# Patient Record
Sex: Female | Born: 1945
Health system: Southern US, Community
[De-identification: ages and names within clinical notes are randomized; demographics above are authoritative.]

## PROBLEM LIST (undated history)

## (undated) DIAGNOSIS — E669 Obesity, unspecified: Secondary | ICD-10-CM

## (undated) DIAGNOSIS — M810 Age-related osteoporosis without current pathological fracture: Secondary | ICD-10-CM

## (undated) DIAGNOSIS — Z8619 Personal history of other infectious and parasitic diseases: Secondary | ICD-10-CM

## (undated) DIAGNOSIS — Z9889 Other specified postprocedural states: Secondary | ICD-10-CM

## (undated) DIAGNOSIS — I10 Essential (primary) hypertension: Secondary | ICD-10-CM

## (undated) DIAGNOSIS — M1990 Unspecified osteoarthritis, unspecified site: Secondary | ICD-10-CM

## (undated) DIAGNOSIS — F325 Major depressive disorder, single episode, in full remission: Secondary | ICD-10-CM

## (undated) DIAGNOSIS — G4733 Obstructive sleep apnea (adult) (pediatric): Secondary | ICD-10-CM

## (undated) DIAGNOSIS — E119 Type 2 diabetes mellitus without complications: Secondary | ICD-10-CM

## (undated) DIAGNOSIS — K760 Fatty (change of) liver, not elsewhere classified: Secondary | ICD-10-CM

## (undated) HISTORY — PX: HERNIA REPAIR: SHX51

## (undated) HISTORY — DX: Fatty (change of) liver, not elsewhere classified: K76.0

## (undated) HISTORY — DX: Major depressive disorder, single episode, in full remission: F32.5

## (undated) HISTORY — PX: TONSILLECTOMY: SUR1361

## (undated) HISTORY — DX: Personal history of other infectious and parasitic diseases: Z86.19

## (undated) HISTORY — DX: Age-related osteoporosis without current pathological fracture: M81.0

## (undated) HISTORY — DX: Obesity, unspecified: E66.9

## (undated) HISTORY — DX: Obstructive sleep apnea (adult) (pediatric): G47.33

## (undated) HISTORY — DX: Essential (primary) hypertension: I10

---

## 2001-12-29 ENCOUNTER — Other Ambulatory Visit: Admission: RE | Admit: 2001-12-29 | Discharge: 2001-12-29 | Payer: Self-pay | Admitting: Internal Medicine

## 2001-12-31 ENCOUNTER — Encounter: Payer: Self-pay | Admitting: Internal Medicine

## 2001-12-31 ENCOUNTER — Encounter: Admission: RE | Admit: 2001-12-31 | Discharge: 2001-12-31 | Payer: Self-pay | Admitting: Internal Medicine

## 2003-02-04 ENCOUNTER — Other Ambulatory Visit: Admission: RE | Admit: 2003-02-04 | Discharge: 2003-02-04 | Payer: Self-pay | Admitting: Internal Medicine

## 2004-05-08 ENCOUNTER — Other Ambulatory Visit: Admission: RE | Admit: 2004-05-08 | Discharge: 2004-05-08 | Payer: Self-pay | Admitting: Internal Medicine

## 2004-06-07 ENCOUNTER — Ambulatory Visit (HOSPITAL_COMMUNITY): Admission: RE | Admit: 2004-06-07 | Discharge: 2004-06-07 | Payer: Self-pay | Admitting: *Deleted

## 2005-05-10 ENCOUNTER — Other Ambulatory Visit: Admission: RE | Admit: 2005-05-10 | Discharge: 2005-05-10 | Payer: Self-pay | Admitting: Internal Medicine

## 2006-06-07 ENCOUNTER — Other Ambulatory Visit: Admission: RE | Admit: 2006-06-07 | Discharge: 2006-06-07 | Payer: Self-pay | Admitting: Family Medicine

## 2010-08-07 ENCOUNTER — Other Ambulatory Visit: Payer: Self-pay | Admitting: Family Medicine

## 2010-08-07 DIAGNOSIS — M25512 Pain in left shoulder: Secondary | ICD-10-CM

## 2010-08-09 ENCOUNTER — Ambulatory Visit
Admission: RE | Admit: 2010-08-09 | Discharge: 2010-08-09 | Disposition: A | Discharge status: Home / Self Care | Payer: Medicare Other | Source: Ambulatory Visit | Attending: Family Medicine | Admitting: Family Medicine

## 2010-08-09 DIAGNOSIS — M25512 Pain in left shoulder: Secondary | ICD-10-CM

## 2011-08-17 DIAGNOSIS — E559 Vitamin D deficiency, unspecified: Secondary | ICD-10-CM | POA: Diagnosis not present

## 2011-09-12 DIAGNOSIS — M810 Age-related osteoporosis without current pathological fracture: Secondary | ICD-10-CM | POA: Diagnosis not present

## 2011-11-17 DIAGNOSIS — J069 Acute upper respiratory infection, unspecified: Secondary | ICD-10-CM | POA: Diagnosis not present

## 2012-01-08 DIAGNOSIS — E559 Vitamin D deficiency, unspecified: Secondary | ICD-10-CM | POA: Diagnosis not present

## 2012-03-10 DIAGNOSIS — Z961 Presence of intraocular lens: Secondary | ICD-10-CM | POA: Diagnosis not present

## 2012-03-10 DIAGNOSIS — H524 Presbyopia: Secondary | ICD-10-CM | POA: Diagnosis not present

## 2012-03-10 DIAGNOSIS — H2640 Unspecified secondary cataract: Secondary | ICD-10-CM | POA: Diagnosis not present

## 2012-03-12 DIAGNOSIS — M810 Age-related osteoporosis without current pathological fracture: Secondary | ICD-10-CM | POA: Diagnosis not present

## 2012-03-12 DIAGNOSIS — Z23 Encounter for immunization: Secondary | ICD-10-CM | POA: Diagnosis not present

## 2012-03-12 DIAGNOSIS — R7301 Impaired fasting glucose: Secondary | ICD-10-CM | POA: Diagnosis not present

## 2012-03-12 DIAGNOSIS — Z1211 Encounter for screening for malignant neoplasm of colon: Secondary | ICD-10-CM | POA: Diagnosis not present

## 2012-03-12 DIAGNOSIS — I10 Essential (primary) hypertension: Secondary | ICD-10-CM | POA: Diagnosis not present

## 2012-03-12 DIAGNOSIS — E559 Vitamin D deficiency, unspecified: Secondary | ICD-10-CM | POA: Diagnosis not present

## 2012-03-12 DIAGNOSIS — Z0000 Encounter for general adult medical examination without abnormal findings: Secondary | ICD-10-CM | POA: Diagnosis not present

## 2012-03-26 DIAGNOSIS — R7989 Other specified abnormal findings of blood chemistry: Secondary | ICD-10-CM | POA: Diagnosis not present

## 2012-03-26 DIAGNOSIS — M810 Age-related osteoporosis without current pathological fracture: Secondary | ICD-10-CM | POA: Diagnosis not present

## 2012-03-31 DIAGNOSIS — I10 Essential (primary) hypertension: Secondary | ICD-10-CM | POA: Diagnosis not present

## 2012-03-31 DIAGNOSIS — R0681 Apnea, not elsewhere classified: Secondary | ICD-10-CM | POA: Diagnosis not present

## 2012-03-31 DIAGNOSIS — G4710 Hypersomnia, unspecified: Secondary | ICD-10-CM | POA: Diagnosis not present

## 2012-04-01 DIAGNOSIS — K760 Fatty (change of) liver, not elsewhere classified: Secondary | ICD-10-CM

## 2012-04-01 HISTORY — DX: Fatty (change of) liver, not elsewhere classified: K76.0

## 2012-04-11 DIAGNOSIS — R7989 Other specified abnormal findings of blood chemistry: Secondary | ICD-10-CM | POA: Diagnosis not present

## 2012-04-11 DIAGNOSIS — E559 Vitamin D deficiency, unspecified: Secondary | ICD-10-CM | POA: Diagnosis not present

## 2012-04-28 DIAGNOSIS — G4733 Obstructive sleep apnea (adult) (pediatric): Secondary | ICD-10-CM | POA: Diagnosis not present

## 2012-04-28 DIAGNOSIS — Z1231 Encounter for screening mammogram for malignant neoplasm of breast: Secondary | ICD-10-CM | POA: Diagnosis not present

## 2012-07-07 DIAGNOSIS — E559 Vitamin D deficiency, unspecified: Secondary | ICD-10-CM | POA: Diagnosis not present

## 2012-07-07 DIAGNOSIS — E669 Obesity, unspecified: Secondary | ICD-10-CM | POA: Diagnosis not present

## 2012-07-07 DIAGNOSIS — R6889 Other general symptoms and signs: Secondary | ICD-10-CM | POA: Diagnosis not present

## 2012-07-07 DIAGNOSIS — G4733 Obstructive sleep apnea (adult) (pediatric): Secondary | ICD-10-CM | POA: Diagnosis not present

## 2012-07-07 DIAGNOSIS — I10 Essential (primary) hypertension: Secondary | ICD-10-CM | POA: Diagnosis not present

## 2012-09-23 DIAGNOSIS — E559 Vitamin D deficiency, unspecified: Secondary | ICD-10-CM | POA: Diagnosis not present

## 2012-09-23 DIAGNOSIS — M810 Age-related osteoporosis without current pathological fracture: Secondary | ICD-10-CM | POA: Diagnosis not present

## 2013-02-09 DIAGNOSIS — G4733 Obstructive sleep apnea (adult) (pediatric): Secondary | ICD-10-CM | POA: Diagnosis not present

## 2013-02-09 DIAGNOSIS — E669 Obesity, unspecified: Secondary | ICD-10-CM | POA: Diagnosis not present

## 2013-02-09 DIAGNOSIS — I10 Essential (primary) hypertension: Secondary | ICD-10-CM | POA: Diagnosis not present

## 2013-03-18 DIAGNOSIS — Z0000 Encounter for general adult medical examination without abnormal findings: Secondary | ICD-10-CM | POA: Diagnosis not present

## 2013-03-18 DIAGNOSIS — I10 Essential (primary) hypertension: Secondary | ICD-10-CM | POA: Diagnosis not present

## 2013-03-18 DIAGNOSIS — E559 Vitamin D deficiency, unspecified: Secondary | ICD-10-CM | POA: Diagnosis not present

## 2013-03-18 DIAGNOSIS — G4733 Obstructive sleep apnea (adult) (pediatric): Secondary | ICD-10-CM | POA: Diagnosis not present

## 2013-03-18 DIAGNOSIS — Z23 Encounter for immunization: Secondary | ICD-10-CM | POA: Diagnosis not present

## 2013-03-18 DIAGNOSIS — Z136 Encounter for screening for cardiovascular disorders: Secondary | ICD-10-CM | POA: Diagnosis not present

## 2013-03-18 DIAGNOSIS — M810 Age-related osteoporosis without current pathological fracture: Secondary | ICD-10-CM | POA: Diagnosis not present

## 2013-03-18 DIAGNOSIS — J019 Acute sinusitis, unspecified: Secondary | ICD-10-CM | POA: Diagnosis not present

## 2013-03-18 DIAGNOSIS — R7301 Impaired fasting glucose: Secondary | ICD-10-CM | POA: Diagnosis not present

## 2013-03-23 DIAGNOSIS — H52209 Unspecified astigmatism, unspecified eye: Secondary | ICD-10-CM | POA: Diagnosis not present

## 2013-03-23 DIAGNOSIS — Z961 Presence of intraocular lens: Secondary | ICD-10-CM | POA: Diagnosis not present

## 2013-04-06 DIAGNOSIS — M810 Age-related osteoporosis without current pathological fracture: Secondary | ICD-10-CM | POA: Diagnosis not present

## 2013-04-30 DIAGNOSIS — Z1231 Encounter for screening mammogram for malignant neoplasm of breast: Secondary | ICD-10-CM | POA: Diagnosis not present

## 2013-04-30 DIAGNOSIS — M899 Disorder of bone, unspecified: Secondary | ICD-10-CM | POA: Diagnosis not present

## 2013-06-15 DIAGNOSIS — Z23 Encounter for immunization: Secondary | ICD-10-CM | POA: Diagnosis not present

## 2013-06-15 DIAGNOSIS — R7301 Impaired fasting glucose: Secondary | ICD-10-CM | POA: Diagnosis not present

## 2013-08-13 ENCOUNTER — Ambulatory Visit: Payer: No Typology Code available for payment source | Admitting: Cardiology

## 2013-08-24 ENCOUNTER — Encounter: Payer: Self-pay | Admitting: General Surgery

## 2013-08-24 DIAGNOSIS — I10 Essential (primary) hypertension: Secondary | ICD-10-CM | POA: Insufficient documentation

## 2013-08-24 DIAGNOSIS — G4733 Obstructive sleep apnea (adult) (pediatric): Secondary | ICD-10-CM | POA: Insufficient documentation

## 2013-08-24 DIAGNOSIS — E669 Obesity, unspecified: Secondary | ICD-10-CM | POA: Insufficient documentation

## 2013-08-25 ENCOUNTER — Ambulatory Visit: Payer: No Typology Code available for payment source | Admitting: Cardiology

## 2013-09-18 ENCOUNTER — Ambulatory Visit (INDEPENDENT_AMBULATORY_CARE_PROVIDER_SITE_OTHER): Payer: Medicare Other | Admitting: Cardiology

## 2013-09-18 ENCOUNTER — Encounter: Payer: Self-pay | Admitting: Cardiology

## 2013-09-18 VITALS — BP 162/80 | HR 74 | Ht 65.0 in | Wt 214.8 lb

## 2013-09-18 DIAGNOSIS — E669 Obesity, unspecified: Secondary | ICD-10-CM

## 2013-09-18 DIAGNOSIS — I10 Essential (primary) hypertension: Secondary | ICD-10-CM | POA: Diagnosis not present

## 2013-09-18 DIAGNOSIS — G4733 Obstructive sleep apnea (adult) (pediatric): Secondary | ICD-10-CM

## 2013-09-18 NOTE — Patient Instructions (Signed)
Your physician recommends that you continue on your current medications as directed. Please refer to the Current Medication list given to you today.  Your physician wants you to follow-up in: 6 months with Dr Turner You will receive a reminder letter in the mail two months in advance. If you don't receive a letter, please call our office to schedule the follow-up appointment.  

## 2013-09-18 NOTE — Progress Notes (Signed)
  [address] [place], [place]  [address] Phone: [phone] Fax:  [phone]) [phone]  Date:  09/18/2013   ID:  Heather Santos, DOB 1946-03-25, MRN [id number]  PCP:  Lupita RaiderSHAW,KIMBERLEE, MD  Sleep Medicine:  Armanda Magicraci Mahagony Grieb, MD   History of Present Illness: Heather NannyBetty A Herdt is a 68 y.o. female with a history of severe OSA on CPAP, HTN and obesity presents today for followup.  She is doing well.  She tolerates her CPAP well.  She tolerates the new nasal pillow mask and has no problems with it.  She feels the pressure is adequate.  She feels rested when she get up in the am if she sleeps ok and has no daytime sleepiness.  She has not been sleeping well recently due to some stress in her life.   Wt Readings from Last 3 Encounters:  No data found for Wt     Past Medical History  Diagnosis Date  . Major depression in remission   . Hypertension   . Obesity   . Osteoporosis     started prolia 2012  . History of shingles   . Fatty liver 10/13    on U/S w elevated LFTs  . OSA (obstructive sleep apnea)     w CPAP AHI 35/hr now on CPAP at 16 cm H2O    Current Outpatient Prescriptions  Medication Sig Dispense Refill  . Calcium Carbonate-Vit D-Min (CALCIUM 1200 PO) Take 1 tablet by mouth 2 (two) times daily.      . Cholecalciferol 5000 UNITS CHEW Chew 1 tablet by mouth daily.      Marland Kitchen. denosumab (PROLIA) 60 MG/ML SOLN injection Inject 60 mg into the skin every 6 (six) months. Administer in upper arm, thigh, or abdomen      . Multiple Vitamin (MULTIVITAMIN) tablet Take 1 tablet by mouth daily.       No current facility-administered medications for this visit.    Allergies:   No Known Allergies  Social History:  The patient  reports that she has never smoked. She does not have any smokeless tobacco history on file. She reports that she does not drink alcohol or use illicit drugs.   Family History:  The patient's family history includes CAD in her father.   ROS:  Please see the history of  present illness.      All other systems reviewed and negative.   PHYSICAL EXAM: VS:  There were no vitals taken for this visit. Well nourished, well developed, in no acute distress HEENT: normal Neck: no JVD Cardiac:  normal S1, S2; RRR; no murmur Lungs:  clear to auscultation bilaterally, no wheezing, rhonchi or rales Abd: soft, nontender, no hepatomegaly Ext: no edema Skin: warm and dry Neuro:  CNs 2-12 intact, no focal abnormalities noted  ASSESSMENT AND PLAN:  1. OSA on CPAP and tolerating well.  Her download today showed an AHI of 0.3/hr on 16cm H2O.  Her compliance is 65% in using more than 4 hours nightly.  She tends to fall asleep on the couch sometimes at night if she is stressed and does not have it on.  I encouraged her to try to wear the mask nightly. 2. HTN mildly elevated - her BP at home runs 120-130/60-1970mmHg 3. Obesity - she plans on getting back into an exercise regimen and [facility]  Followup with me in 6 months  Signed, Armanda Magicraci Giannah Zavadil, MD 09/18/2013 3:06 PM

## 2013-10-06 DIAGNOSIS — M810 Age-related osteoporosis without current pathological fracture: Secondary | ICD-10-CM | POA: Diagnosis not present

## 2013-10-09 ENCOUNTER — Encounter: Payer: Self-pay | Admitting: Cardiology

## 2014-03-22 DIAGNOSIS — R7301 Impaired fasting glucose: Secondary | ICD-10-CM | POA: Diagnosis not present

## 2014-03-22 DIAGNOSIS — Z6834 Body mass index (BMI) 34.0-34.9, adult: Secondary | ICD-10-CM | POA: Diagnosis not present

## 2014-03-22 DIAGNOSIS — I10 Essential (primary) hypertension: Secondary | ICD-10-CM | POA: Diagnosis not present

## 2014-03-22 DIAGNOSIS — Z0000 Encounter for general adult medical examination without abnormal findings: Secondary | ICD-10-CM | POA: Diagnosis not present

## 2014-03-22 DIAGNOSIS — M810 Age-related osteoporosis without current pathological fracture: Secondary | ICD-10-CM | POA: Diagnosis not present

## 2014-03-22 DIAGNOSIS — Z23 Encounter for immunization: Secondary | ICD-10-CM | POA: Diagnosis not present

## 2014-03-22 DIAGNOSIS — G4733 Obstructive sleep apnea (adult) (pediatric): Secondary | ICD-10-CM | POA: Diagnosis not present

## 2014-03-22 DIAGNOSIS — K7689 Other specified diseases of liver: Secondary | ICD-10-CM | POA: Diagnosis not present

## 2014-03-22 DIAGNOSIS — E559 Vitamin D deficiency, unspecified: Secondary | ICD-10-CM | POA: Diagnosis not present

## 2014-03-23 ENCOUNTER — Encounter: Payer: Self-pay | Admitting: Cardiology

## 2014-03-24 ENCOUNTER — Ambulatory Visit (INDEPENDENT_AMBULATORY_CARE_PROVIDER_SITE_OTHER): Payer: Medicare Other | Admitting: Cardiology

## 2014-03-24 ENCOUNTER — Encounter: Payer: Self-pay | Admitting: Cardiology

## 2014-03-24 VITALS — BP 140/72 | HR 76 | Ht 65.0 in | Wt 208.0 lb

## 2014-03-24 DIAGNOSIS — E669 Obesity, unspecified: Secondary | ICD-10-CM | POA: Diagnosis not present

## 2014-03-24 DIAGNOSIS — G4733 Obstructive sleep apnea (adult) (pediatric): Secondary | ICD-10-CM

## 2014-03-24 DIAGNOSIS — I10 Essential (primary) hypertension: Secondary | ICD-10-CM | POA: Diagnosis not present

## 2014-03-24 NOTE — Progress Notes (Signed)
[address] [place], [place]  [address] Phone: [phone] Fax:  [phone]  Date:  03/24/2014   ID:  Heather Santos, DOB [birth year], MRN [id number]  PCP:  Lupita Raider, MD  Cardiologist:  Armanda Magic, MD    History of Present Illness: Heather Santos is a 68 y.o. female with a history of severe OSA on CPAP, HTN and obesity presents today for followup. She is doing well. She tolerates her CPAP well. She tolerates the new nasal pillow mask and has no problems with it. She feels the pressure is adequate. She feels rested when she get up in the am if she sleeps ok and has no daytime sleepiness.  She denies any nasal congestion.  She walks 30 minutes 3 times weekly.    Wt Readings from Last 3 Encounters:  03/24/14 208 lb (94.348 kg)  09/18/13 214 lb 12.8 oz (97.433 kg)     Past Medical History  Diagnosis Date  . Major depression in remission   . Hypertension   . Obesity   . Osteoporosis     started prolia 2012  . History of shingles   . Fatty liver 10/13    on U/S w elevated LFTs  . OSA (obstructive sleep apnea)     w CPAP AHI 35/hr now on CPAP at 16 cm H2O    Current Outpatient Prescriptions  Medication Sig Dispense Refill  . Calcium Carbonate-Vit D-Min (CALCIUM 1200 PO) Take 1 tablet by mouth 2 (two) times daily.      . Cholecalciferol (VITAMIN D) 400 UNIT/ML LIQD Take by mouth daily.      Marland Kitchen denosumab (PROLIA) 60 MG/ML SOLN injection Inject 60 mg into the skin every 6 (six) months. Administer in upper arm, thigh, or abdomen      . Multiple Vitamin (MULTIVITAMIN) tablet Take 1 tablet by mouth daily.       No current facility-administered medications for this visit.    Allergies:   No Known Allergies  Social History:  The patient  reports that she has never smoked. She does not have any smokeless tobacco history on file. She reports that she drinks alcohol. She reports that she does not use illicit drugs.   Family History:  The patient's family history  includes CAD in her father.   ROS:  Please see the history of present illness.      All other systems reviewed and negative.   PHYSICAL EXAM: VS:  BP 140/72  Pulse 76  Ht  (1.651 m)  Wt 208 lb (94.348 kg)  BMI 34.61 kg/m2 Well nourished, well developed, in no acute distress HEENT: normal Neck: no JVD Cardiac:  normal S1, S2; RRR; no murmur Lungs:  clear to auscultation bilaterally, no wheezing, rhonchi or rales Abd: soft, nontender, no hepatomegaly Ext: no edema Skin: warm and dry Neuro:  CNs 2-12 intact, no focal abnormalities noted  ASSESSMENT AND PLAN:  1. OSA on CPAP and tolerating well. Her download today showed an AHI of 0.4/hr on 16cm H2O. Her compliance is 62% in using more than 4 hours nightly. She tends to fall asleep on the couch sometimes at night if she is stressed and does not have it on. I encouraged her to try to wear the mask nightly. 2. HTN controlled        3.   Obesity - she is enrolling in an exercise program and will continue her walking as well   Followup with me in 1 year  Signed, Armanda Magic, MD [facility] 03/24/2014 9:15 AM

## 2014-03-24 NOTE — Patient Instructions (Signed)
Your physician recommends that you continue on your current medications as directed. Please refer to the Current Medication list given to you today.  Continue current CPAP/BiPAP Settings.  Your physician wants you to follow-up in: 1 year with Dr Sherlyn Lick will receive a reminder letter in the mail two months in advance. If you don't receive a letter, please call our office to schedule the follow-up appointment.

## 2014-03-25 DIAGNOSIS — H905 Unspecified sensorineural hearing loss: Secondary | ICD-10-CM | POA: Diagnosis not present

## 2014-03-25 DIAGNOSIS — H93299 Other abnormal auditory perceptions, unspecified ear: Secondary | ICD-10-CM | POA: Diagnosis not present

## 2014-03-30 DIAGNOSIS — H18519 Endothelial corneal dystrophy, unspecified eye: Secondary | ICD-10-CM | POA: Diagnosis not present

## 2014-03-30 DIAGNOSIS — H2640 Unspecified secondary cataract: Secondary | ICD-10-CM | POA: Diagnosis not present

## 2014-03-30 DIAGNOSIS — Z961 Presence of intraocular lens: Secondary | ICD-10-CM | POA: Diagnosis not present

## 2014-04-08 DIAGNOSIS — M810 Age-related osteoporosis without current pathological fracture: Secondary | ICD-10-CM | POA: Diagnosis not present

## 2014-04-16 DIAGNOSIS — R748 Abnormal levels of other serum enzymes: Secondary | ICD-10-CM | POA: Diagnosis not present

## 2014-05-03 DIAGNOSIS — K7581 Nonalcoholic steatohepatitis (NASH): Secondary | ICD-10-CM | POA: Diagnosis not present

## 2014-05-03 DIAGNOSIS — Z1231 Encounter for screening mammogram for malignant neoplasm of breast: Secondary | ICD-10-CM | POA: Diagnosis not present

## 2014-09-21 DIAGNOSIS — Z6833 Body mass index (BMI) 33.0-33.9, adult: Secondary | ICD-10-CM | POA: Diagnosis not present

## 2014-09-21 DIAGNOSIS — M810 Age-related osteoporosis without current pathological fracture: Secondary | ICD-10-CM | POA: Diagnosis not present

## 2014-09-21 DIAGNOSIS — R7301 Impaired fasting glucose: Secondary | ICD-10-CM | POA: Diagnosis not present

## 2014-09-21 DIAGNOSIS — E669 Obesity, unspecified: Secondary | ICD-10-CM | POA: Diagnosis not present

## 2014-09-21 DIAGNOSIS — K7581 Nonalcoholic steatohepatitis (NASH): Secondary | ICD-10-CM | POA: Diagnosis not present

## 2014-09-21 DIAGNOSIS — I10 Essential (primary) hypertension: Secondary | ICD-10-CM | POA: Diagnosis not present

## 2014-09-21 DIAGNOSIS — E559 Vitamin D deficiency, unspecified: Secondary | ICD-10-CM | POA: Diagnosis not present

## 2014-10-11 DIAGNOSIS — M810 Age-related osteoporosis without current pathological fracture: Secondary | ICD-10-CM | POA: Diagnosis not present

## 2014-11-26 DIAGNOSIS — J45909 Unspecified asthma, uncomplicated: Secondary | ICD-10-CM | POA: Diagnosis not present

## 2014-11-26 DIAGNOSIS — J329 Chronic sinusitis, unspecified: Secondary | ICD-10-CM | POA: Diagnosis not present

## 2015-01-06 ENCOUNTER — Encounter: Payer: Self-pay | Admitting: Cardiology

## 2015-04-01 ENCOUNTER — Ambulatory Visit (INDEPENDENT_AMBULATORY_CARE_PROVIDER_SITE_OTHER): Payer: Medicare Other | Admitting: Cardiology

## 2015-04-01 ENCOUNTER — Encounter: Payer: Self-pay | Admitting: Cardiology

## 2015-04-01 VITALS — BP 140/70 | HR 82 | Ht 65.0 in | Wt 201.2 lb

## 2015-04-01 DIAGNOSIS — I10 Essential (primary) hypertension: Secondary | ICD-10-CM | POA: Diagnosis not present

## 2015-04-01 DIAGNOSIS — E669 Obesity, unspecified: Secondary | ICD-10-CM | POA: Diagnosis not present

## 2015-04-01 DIAGNOSIS — G4733 Obstructive sleep apnea (adult) (pediatric): Secondary | ICD-10-CM

## 2015-04-01 NOTE — Progress Notes (Signed)
Cardiology Office Note   Date:  04/01/2015   ID:  Heather Santos, DOB 04-08-1946, MRN [id number]  PCP:  Lupita Raider, MD    Chief Complaint  Patient presents with  . OSA      History of Present Illness: Heather Santos is a 69 y.o. female with a history of severe OSA on CPAP, HTN and obesity presents today for followup. She is doing well. She tolerates her CPAP well. She tolerates the nasal pillow mask and has no problems with it. She has some dryness in her mouth.  She feels the pressure is adequate. She feels rested when she get up in the am and has no daytime sleepiness. She denies any nasal congestion. She walks 60 minutes 3 times weekly.    Past Medical History  Diagnosis Date  . Major depression in remission   . Hypertension   . Obesity   . Osteoporosis     started prolia 2012  . History of shingles   . Fatty liver 10/13    on U/S w elevated LFTs  . OSA (obstructive sleep apnea)     w CPAP AHI 35/hr now on CPAP at 16 cm H2O    Past Surgical History  Procedure Laterality Date  . Hernia repair       Current Outpatient Prescriptions  Medication Sig Dispense Refill  . Calcium Carbonate-Vit D-Min (CALCIUM 1200 PO) Take 1 tablet by mouth 2 (two) times daily.    . Cholecalciferol (VITAMIN D) 400 UNIT/ML LIQD Take by mouth daily.    Marland Kitchen denosumab (PROLIA) 60 MG/ML SOLN injection Inject 60 mg into the skin every 6 (six) months. Administer in upper arm, thigh, or abdomen    . Multiple Vitamin (MULTIVITAMIN) tablet Take 1 tablet by mouth daily.     No current facility-administered medications for this visit.    Allergies:   Review of patient's allergies indicates no known allergies.    Social History:  The patient  reports that she has never smoked. She does not have any smokeless tobacco history on file. She reports that she drinks alcohol. She reports that she does not use illicit drugs.   Family History:  The patient's family history includes CAD  in her father.    ROS:  Please see the history of present illness.   Otherwise, review of systems are positive for none.   All other systems are reviewed and negative.    PHYSICAL EXAM: VS:  BP 140/70 mmHg  Pulse 82  Ht  (1.651 m)  Wt 201 lb 3.2 oz (91.264 kg)  BMI 33.48 kg/m2  SpO2 97% , BMI Body mass index is 33.48 kg/(m^2). GEN: Well nourished, well developed, in no acute distress HEENT: normal Neck: no JVD, carotid bruits, or masses Cardiac: RRR; no murmurs, rubs, or gallops,no edema  Respiratory:  clear to auscultation bilaterally, normal work of breathing GI: soft, nontender, nondistended, + BS MS: no deformity or atrophy Skin: warm and dry, no rash Neuro:  Strength and sensation are intact Psych: euthymic mood, full affect   EKG:  EKG is not ordered today.    Recent Labs: No results found for requested labs within last 365 days.    Lipid Panel No results found for: CHOL, TRIG, HDL, CHOLHDL, VLDL, LDLCALC, LDLDIRECT    Wt Readings from Last 3 Encounters:  04/01/15 201 lb 3.2 oz (91.264 kg)  03/24/14 208 lb (  94.348 kg)  09/18/13 214 lb 12.8 oz (97.433 kg)    ASSESSMENT AND PLAN:  1. OSA on CPAP and tolerating well. Her download today showed an AHI of 0.4/hr on 16cm H2O. Her compliance is 57% in using more than 4 hours nightly. [place].  She feels the pressure could come down some so I will get a 2 week autotitration from 4 to 20cm H2O and see what pressure we can drop her to.   2. HTN controlled  3. Obesity - she will continue her walking and plans to get back on her diet    Current medicines are reviewed at length with the patient today.  The patient does not have concerns regarding medicines.  The following changes have been made:  no change  Labs/ tests ordered today: See above Assessment and Plan No orders of the defined types were placed in this encounter.     Disposition:   FU with me in 1  year  Signed, Quintella Reichert, MD  04/01/2015 1:57 PM    [facility] [address] [place], [place], [place]  [address] Phone: [phone]; Fax: [phone]

## 2015-04-01 NOTE — Patient Instructions (Signed)

## 2015-04-04 DIAGNOSIS — Z23 Encounter for immunization: Secondary | ICD-10-CM | POA: Diagnosis not present

## 2015-04-04 DIAGNOSIS — H26493 Other secondary cataract, bilateral: Secondary | ICD-10-CM | POA: Diagnosis not present

## 2015-04-04 DIAGNOSIS — M810 Age-related osteoporosis without current pathological fracture: Secondary | ICD-10-CM | POA: Diagnosis not present

## 2015-04-04 DIAGNOSIS — G4733 Obstructive sleep apnea (adult) (pediatric): Secondary | ICD-10-CM | POA: Diagnosis not present

## 2015-04-04 DIAGNOSIS — Z0000 Encounter for general adult medical examination without abnormal findings: Secondary | ICD-10-CM | POA: Diagnosis not present

## 2015-04-04 DIAGNOSIS — K7581 Nonalcoholic steatohepatitis (NASH): Secondary | ICD-10-CM | POA: Diagnosis not present

## 2015-04-04 DIAGNOSIS — E669 Obesity, unspecified: Secondary | ICD-10-CM | POA: Diagnosis not present

## 2015-04-04 DIAGNOSIS — I10 Essential (primary) hypertension: Secondary | ICD-10-CM | POA: Diagnosis not present

## 2015-04-04 DIAGNOSIS — Z6834 Body mass index (BMI) 34.0-34.9, adult: Secondary | ICD-10-CM | POA: Diagnosis not present

## 2015-04-04 DIAGNOSIS — H524 Presbyopia: Secondary | ICD-10-CM | POA: Diagnosis not present

## 2015-04-04 DIAGNOSIS — E559 Vitamin D deficiency, unspecified: Secondary | ICD-10-CM | POA: Diagnosis not present

## 2015-04-04 DIAGNOSIS — H1851 Endothelial corneal dystrophy: Secondary | ICD-10-CM | POA: Diagnosis not present

## 2015-04-04 DIAGNOSIS — Z1211 Encounter for screening for malignant neoplasm of colon: Secondary | ICD-10-CM | POA: Diagnosis not present

## 2015-04-04 DIAGNOSIS — R7301 Impaired fasting glucose: Secondary | ICD-10-CM | POA: Diagnosis not present

## 2015-04-13 DIAGNOSIS — M810 Age-related osteoporosis without current pathological fracture: Secondary | ICD-10-CM | POA: Diagnosis not present

## 2015-04-27 ENCOUNTER — Encounter: Payer: Self-pay | Admitting: Cardiology

## 2015-04-29 ENCOUNTER — Telehealth: Payer: Self-pay | Admitting: *Deleted

## 2015-04-29 DIAGNOSIS — G4733 Obstructive sleep apnea (adult) (pediatric): Secondary | ICD-10-CM

## 2015-04-29 NOTE — Telephone Encounter (Signed)
-----   Message from Quintella Reichertraci R Turner, MD sent at 04/27/2015  4:06 PM EDT ----- Decrease CPAP to 10cm H2O and encouraged increased compliance - repeat d/l in 4 weeks

## 2015-04-29 NOTE — Telephone Encounter (Signed)
Patient is aware of results. Orders placed and [facility] notified.

## 2015-05-05 DIAGNOSIS — M810 Age-related osteoporosis without current pathological fracture: Secondary | ICD-10-CM | POA: Diagnosis not present

## 2015-05-05 DIAGNOSIS — Z1231 Encounter for screening mammogram for malignant neoplasm of breast: Secondary | ICD-10-CM | POA: Diagnosis not present

## 2015-05-06 ENCOUNTER — Telehealth: Payer: Self-pay | Admitting: Cardiology

## 2015-05-06 NOTE — Telephone Encounter (Signed)
New Message  [facility]. Please call back and discuss.

## 2015-05-06 NOTE — Telephone Encounter (Signed)
[facility]

## 2015-05-13 ENCOUNTER — Encounter: Payer: Self-pay | Admitting: Cardiology

## 2015-09-06 DIAGNOSIS — Z1211 Encounter for screening for malignant neoplasm of colon: Secondary | ICD-10-CM | POA: Diagnosis not present

## 2015-09-06 DIAGNOSIS — K641 Second degree hemorrhoids: Secondary | ICD-10-CM | POA: Diagnosis not present

## 2015-10-07 DIAGNOSIS — K7581 Nonalcoholic steatohepatitis (NASH): Secondary | ICD-10-CM | POA: Diagnosis not present

## 2015-10-07 DIAGNOSIS — I10 Essential (primary) hypertension: Secondary | ICD-10-CM | POA: Diagnosis not present

## 2015-10-07 DIAGNOSIS — R7301 Impaired fasting glucose: Secondary | ICD-10-CM | POA: Diagnosis not present

## 2015-10-07 DIAGNOSIS — Z6833 Body mass index (BMI) 33.0-33.9, adult: Secondary | ICD-10-CM | POA: Diagnosis not present

## 2015-10-07 DIAGNOSIS — E669 Obesity, unspecified: Secondary | ICD-10-CM | POA: Diagnosis not present

## 2015-10-07 DIAGNOSIS — M810 Age-related osteoporosis without current pathological fracture: Secondary | ICD-10-CM | POA: Diagnosis not present

## 2015-10-27 DIAGNOSIS — M810 Age-related osteoporosis without current pathological fracture: Secondary | ICD-10-CM | POA: Diagnosis not present

## 2016-01-31 DIAGNOSIS — B029 Zoster without complications: Secondary | ICD-10-CM | POA: Diagnosis not present

## 2016-04-03 DIAGNOSIS — H52203 Unspecified astigmatism, bilateral: Secondary | ICD-10-CM | POA: Diagnosis not present

## 2016-04-03 DIAGNOSIS — H1851 Endothelial corneal dystrophy: Secondary | ICD-10-CM | POA: Diagnosis not present

## 2016-04-03 DIAGNOSIS — H26493 Other secondary cataract, bilateral: Secondary | ICD-10-CM | POA: Diagnosis not present

## 2016-04-06 DIAGNOSIS — R7301 Impaired fasting glucose: Secondary | ICD-10-CM | POA: Diagnosis not present

## 2016-04-06 DIAGNOSIS — K7581 Nonalcoholic steatohepatitis (NASH): Secondary | ICD-10-CM | POA: Diagnosis not present

## 2016-04-06 DIAGNOSIS — G4733 Obstructive sleep apnea (adult) (pediatric): Secondary | ICD-10-CM | POA: Diagnosis not present

## 2016-04-06 DIAGNOSIS — E669 Obesity, unspecified: Secondary | ICD-10-CM | POA: Diagnosis not present

## 2016-04-06 DIAGNOSIS — Z23 Encounter for immunization: Secondary | ICD-10-CM | POA: Diagnosis not present

## 2016-04-06 DIAGNOSIS — Z1211 Encounter for screening for malignant neoplasm of colon: Secondary | ICD-10-CM | POA: Diagnosis not present

## 2016-04-06 DIAGNOSIS — Z0000 Encounter for general adult medical examination without abnormal findings: Secondary | ICD-10-CM | POA: Diagnosis not present

## 2016-04-06 DIAGNOSIS — M810 Age-related osteoporosis without current pathological fracture: Secondary | ICD-10-CM | POA: Diagnosis not present

## 2016-04-06 DIAGNOSIS — I10 Essential (primary) hypertension: Secondary | ICD-10-CM | POA: Diagnosis not present

## 2016-04-06 DIAGNOSIS — E559 Vitamin D deficiency, unspecified: Secondary | ICD-10-CM | POA: Diagnosis not present

## 2016-04-06 DIAGNOSIS — M79676 Pain in unspecified toe(s): Secondary | ICD-10-CM | POA: Diagnosis not present

## 2016-04-10 ENCOUNTER — Ambulatory Visit
Admission: RE | Admit: 2016-04-10 | Discharge: 2016-04-10 | Disposition: A | Discharge status: Home / Self Care | Payer: Medicare Other | Source: Ambulatory Visit | Attending: Family Medicine | Admitting: Family Medicine

## 2016-04-10 ENCOUNTER — Other Ambulatory Visit: Payer: Self-pay | Admitting: Family Medicine

## 2016-04-10 DIAGNOSIS — M19072 Primary osteoarthritis, left ankle and foot: Secondary | ICD-10-CM | POA: Diagnosis not present

## 2016-04-10 DIAGNOSIS — M79675 Pain in left toe(s): Secondary | ICD-10-CM

## 2016-04-19 ENCOUNTER — Encounter: Payer: Self-pay | Admitting: Cardiology

## 2016-04-30 DIAGNOSIS — M810 Age-related osteoporosis without current pathological fracture: Secondary | ICD-10-CM | POA: Diagnosis not present

## 2016-05-03 ENCOUNTER — Encounter (INDEPENDENT_AMBULATORY_CARE_PROVIDER_SITE_OTHER): Payer: Self-pay

## 2016-05-03 ENCOUNTER — Ambulatory Visit (INDEPENDENT_AMBULATORY_CARE_PROVIDER_SITE_OTHER): Payer: Medicare Other | Admitting: Cardiology

## 2016-05-03 ENCOUNTER — Encounter: Payer: Self-pay | Admitting: Cardiology

## 2016-05-03 VITALS — BP 124/72 | HR 62 | Ht 65.0 in | Wt 199.8 lb

## 2016-05-03 DIAGNOSIS — I10 Essential (primary) hypertension: Secondary | ICD-10-CM | POA: Diagnosis not present

## 2016-05-03 DIAGNOSIS — E6609 Other obesity due to excess calories: Secondary | ICD-10-CM

## 2016-05-03 DIAGNOSIS — G4733 Obstructive sleep apnea (adult) (pediatric): Secondary | ICD-10-CM | POA: Diagnosis not present

## 2016-05-03 NOTE — Progress Notes (Signed)
Cardiology Office Note    Date:  05/03/2016   ID:  Heather NannyBetty A Telfair, DOB [birth year], MRN [id number]  PCP:  Lupita RaiderSHAW,KIMBERLEE, MD  Cardiologist:  Armanda Magicraci Araiyah Cumpton, MD   Chief Complaint  Patient presents with  . Sleep Apnea  . Hypertension    History of Present Illness:  Heather Santos is a 70 y.o. female with a history of severe OSA on CPAP, HTN and obesity presents today for followup. She is doing well. She tolerates her CPAP well. She tolerates the nasal pillow mask and has no problems with it. She has some dryness in her mouth.  She feels the pressure is adequate. She feels rested when she get up in the am and has no daytime sleepiness. She denies any nasal congestion. She walks 60 minutes 3 times weekly.    Past Medical History:  Diagnosis Date  . Fatty liver 10/13   on U/S w elevated LFTs  . History of shingles   . Hypertension   . Major depression in remission (HCC)   . Obesity   . OSA (obstructive sleep apnea)    w CPAP AHI 35/hr now on CPAP at 16 cm H2O  . Osteoporosis    started prolia 2012    Past Surgical History:  Procedure Laterality Date  . HERNIA REPAIR      Current Medications: Outpatient Medications Prior to Visit  Medication Sig Dispense Refill  . Calcium Carbonate-Vit D-Min (CALCIUM 1200 PO) Take 1 tablet by mouth 2 (two) times daily.    . Cholecalciferol (VITAMIN D) 400 UNIT/ML LIQD Take by mouth daily.    Marland Kitchen. denosumab (PROLIA) 60 MG/ML SOLN injection Inject 60 mg into the skin every 6 (six) months. Administer in upper arm, thigh, or abdomen    . Multiple Vitamin (MULTIVITAMIN) tablet Take 1 tablet by mouth daily.     No facility-administered medications prior to visit.      Allergies:   Augmentin [amoxicillin-pot clavulanate]   Social History   Social History  . Marital status: Married    Spouse name: N/A  . Number of children: N/A  . Years of education: N/A   Social History Main Topics  . Smoking status: Never Smoker  . Smokeless tobacco: Never  Used  . Alcohol use Yes     Comment: socailly  . Drug use: No  . Sexual activity: Not Asked   Other Topics Concern  . None   Social History Narrative  . None     Family History:  The patient's family history includes CAD in her father.   ROS:   Please see the history of present illness.    ROS All other systems reviewed and are negative.  No flowsheet data found.     PHYSICAL EXAM:   VS:  BP 124/72   Pulse 62   Ht 5\' 5"  (1.651 m)   Wt 199 lb 12.8 oz (90.6 kg)   SpO2 96%   BMI 33.25 kg/m    GEN: Well nourished, well developed, in no acute distress  HEENT: normal  Neck: no JVD, carotid bruits, or masses Cardiac: RRR; no murmurs, rubs, or gallops,no edema.  Intact distal pulses bilaterally.  Respiratory:  clear to auscultation bilaterally, normal work of breathing GI: soft, nontender, nondistended, + BS MS: no deformity or atrophy  Skin: warm and dry, no rash Neuro:  Alert and Oriented x 3, Strength and sensation are intact Psych: euthymic mood, full affect  Wt Readings from Last 3 Encounters:  05/03/16  199 lb 12.8 oz (90.6 kg)  04/01/15 201 lb 3.2 oz (91.3 kg)  03/24/14 208 lb (94.3 kg)      Studies/Labs Reviewed:   EKG:  EKG is not ordered today.    Recent Labs: No results found for requested labs within last 8760 hours.   Lipid Panel No results found for: CHOL, TRIG, HDL, CHOLHDL, VLDL, LDLCALC, LDLDIRECT  Additional studies/ records that were reviewed today include:  CPAP download    ASSESSMENT:    1. Obstructive sleep apnea   2. Essential hypertension, benign   3. Class 1 obesity due to excess calories with serious comorbidity in adult, unspecified BMI      PLAN:  In order of problems listed above:  OSA - the patient is tolerating PAP therapy well without any problems. The PAP download was reviewed today and showed an AHI of 1.7/hr on 10 cm H2O with 92% compliance in using more than 4 hours nightly.  The patient has been using and  benefiting from CPAP use and will continue to benefit from therapy.  2.   HTN - BP controlled on current meds. 3.   Obesity - I have encouraged her to continue in her exercise program and cut back on carbs and portions.     Medication Adjustments/Labs and Tests Ordered: Current medicines are reviewed at length with the patient today.  Concerns regarding medicines are outlined above.  Medication changes, Labs and Tests ordered today are listed in the Patient Instructions below.  There are no Patient Instructions on file for this visit.   Signed, Armanda Magicraci Conita Amenta, MD  05/03/2016 8:22 AM    [facility] [address], [place], [place]  [address] Phone: [phone]([phone]; Fax: [phone]

## 2016-05-03 NOTE — Patient Instructions (Signed)

## 2016-05-07 ENCOUNTER — Encounter: Payer: Self-pay | Admitting: Cardiology

## 2016-05-07 DIAGNOSIS — Z1231 Encounter for screening mammogram for malignant neoplasm of breast: Secondary | ICD-10-CM | POA: Diagnosis not present

## 2016-05-10 DIAGNOSIS — H26492 Other secondary cataract, left eye: Secondary | ICD-10-CM | POA: Diagnosis not present

## 2016-05-17 DIAGNOSIS — H26491 Other secondary cataract, right eye: Secondary | ICD-10-CM | POA: Diagnosis not present

## 2016-10-11 DIAGNOSIS — I10 Essential (primary) hypertension: Secondary | ICD-10-CM | POA: Diagnosis not present

## 2016-10-11 DIAGNOSIS — K7581 Nonalcoholic steatohepatitis (NASH): Secondary | ICD-10-CM | POA: Diagnosis not present

## 2016-10-11 DIAGNOSIS — E669 Obesity, unspecified: Secondary | ICD-10-CM | POA: Diagnosis not present

## 2016-10-11 DIAGNOSIS — J029 Acute pharyngitis, unspecified: Secondary | ICD-10-CM | POA: Diagnosis not present

## 2016-10-11 DIAGNOSIS — R7301 Impaired fasting glucose: Secondary | ICD-10-CM | POA: Diagnosis not present

## 2016-10-23 ENCOUNTER — Other Ambulatory Visit: Payer: Self-pay | Admitting: Gastroenterology

## 2016-10-23 DIAGNOSIS — Z1211 Encounter for screening for malignant neoplasm of colon: Secondary | ICD-10-CM | POA: Diagnosis not present

## 2016-10-23 DIAGNOSIS — E669 Obesity, unspecified: Secondary | ICD-10-CM | POA: Diagnosis not present

## 2016-10-23 DIAGNOSIS — K648 Other hemorrhoids: Secondary | ICD-10-CM | POA: Diagnosis not present

## 2016-11-01 DIAGNOSIS — M810 Age-related osteoporosis without current pathological fracture: Secondary | ICD-10-CM | POA: Diagnosis not present

## 2016-12-03 ENCOUNTER — Encounter (HOSPITAL_COMMUNITY): Payer: Self-pay | Admitting: *Deleted

## 2016-12-04 ENCOUNTER — Encounter (HOSPITAL_COMMUNITY): Payer: Self-pay

## 2016-12-04 ENCOUNTER — Encounter (HOSPITAL_COMMUNITY)
Admission: RE | Disposition: A | Discharge status: Home / Self Care | Payer: Self-pay | Source: Ambulatory Visit | Attending: Gastroenterology

## 2016-12-04 ENCOUNTER — Ambulatory Visit (HOSPITAL_COMMUNITY): Payer: Medicare Other | Admitting: Certified Registered"

## 2016-12-04 ENCOUNTER — Ambulatory Visit (HOSPITAL_COMMUNITY)
Admission: RE | Admit: 2016-12-04 | Discharge: 2016-12-04 | Disposition: A | Discharge status: Home / Self Care | Payer: Medicare Other | Source: Ambulatory Visit | Attending: Gastroenterology | Admitting: Gastroenterology

## 2016-12-04 DIAGNOSIS — Z1211 Encounter for screening for malignant neoplasm of colon: Secondary | ICD-10-CM | POA: Diagnosis not present

## 2016-12-04 DIAGNOSIS — Z79899 Other long term (current) drug therapy: Secondary | ICD-10-CM | POA: Insufficient documentation

## 2016-12-04 DIAGNOSIS — K648 Other hemorrhoids: Secondary | ICD-10-CM | POA: Insufficient documentation

## 2016-12-04 DIAGNOSIS — I10 Essential (primary) hypertension: Secondary | ICD-10-CM | POA: Insufficient documentation

## 2016-12-04 DIAGNOSIS — G4730 Sleep apnea, unspecified: Secondary | ICD-10-CM | POA: Diagnosis not present

## 2016-12-04 DIAGNOSIS — Z9989 Dependence on other enabling machines and devices: Secondary | ICD-10-CM | POA: Diagnosis not present

## 2016-12-04 DIAGNOSIS — M810 Age-related osteoporosis without current pathological fracture: Secondary | ICD-10-CM | POA: Diagnosis not present

## 2016-12-04 DIAGNOSIS — G4733 Obstructive sleep apnea (adult) (pediatric): Secondary | ICD-10-CM | POA: Diagnosis not present

## 2016-12-04 HISTORY — PX: COLONOSCOPY WITH PROPOFOL: SHX5780

## 2016-12-04 SURGERY — COLONOSCOPY WITH PROPOFOL
Anesthesia: Monitor Anesthesia Care

## 2016-12-04 MED ORDER — LIDOCAINE 2% (20 MG/ML) 5 ML SYRINGE
INTRAMUSCULAR | Status: DC | PRN
Start: 2016-12-04 — End: 2016-12-04
  Administered 2016-12-04: 100 mg via INTRAVENOUS

## 2016-12-04 MED ORDER — PROPOFOL 10 MG/ML IV BOLUS
INTRAVENOUS | Status: DC | PRN
  Administered 2016-12-04: 20 mg via INTRAVENOUS
  Administered 2016-12-04: 30 mg via INTRAVENOUS

## 2016-12-04 MED ORDER — LIDOCAINE 2% (20 MG/ML) 5 ML SYRINGE
INTRAMUSCULAR | Status: AC
  Filled 2016-12-04: qty 5

## 2016-12-04 MED ORDER — LACTATED RINGERS IV SOLN
INTRAVENOUS | Status: DC
  Administered 2016-12-04: 1000 mL via INTRAVENOUS

## 2016-12-04 MED ORDER — PROPOFOL 10 MG/ML IV BOLUS
INTRAVENOUS | Status: AC
  Filled 2016-12-04: qty 40

## 2016-12-04 MED ORDER — PROPOFOL 500 MG/50ML IV EMUL
INTRAVENOUS | Status: DC | PRN
  Administered 2016-12-04: 100 ug/kg/min via INTRAVENOUS

## 2016-12-04 MED ORDER — SODIUM CHLORIDE 0.9 % IV SOLN: INTRAVENOUS | Status: DC

## 2016-12-04 SURGICAL SUPPLY — 22 items

## 2016-12-04 NOTE — Anesthesia Postprocedure Evaluation (Signed)
Anesthesia Post Note  Patient: Heather Santos  Procedure(s) Performed: Procedure(s) (LRB): COLONOSCOPY WITH PROPOFOL (N/A)     Patient location during evaluation: Endoscopy Anesthesia Type: MAC Level of consciousness: awake and alert Pain management: pain level controlled Vital Signs Assessment: post-procedure vital signs reviewed and stable Respiratory status: spontaneous breathing, nonlabored ventilation, respiratory function stable and patient connected to nasal cannula oxygen Cardiovascular status: stable and blood pressure returned to baseline Anesthetic complications: no    Last Vitals:  Vitals:   12/04/16 0800 12/04/16 0810  BP: (!) 103/47 (!) 132/49  Pulse:    Resp:    Temp:      Last Pain:  Vitals:   12/04/16 0754  TempSrc: Oral                 Montez Hageman

## 2016-12-04 NOTE — Discharge Instructions (Signed)
YOU HAD AN ENDOSCOPIC PROCEDURE TODAY: Refer to the procedure report and other information in the discharge instructions given to you for any specific questions about what was found during the examination. If this information does not answer your questions, please call [facility] at [phone] to clarify.   YOU SHOULD EXPECT: Some feelings of bloating in the abdomen. Passage of more gas than usual. Walking can help get rid of the air that was put into your GI tract during the procedure and reduce the bloating. If you had a lower endoscopy (such as a colonoscopy or flexible sigmoidoscopy) you may notice spotting of blood in your stool or on the toilet paper. Some abdominal soreness may be present for a day or two, also.  DIET: Your first meal following the procedure should be a light meal and then it is ok to progress to your normal diet. A half-sandwich or bowl of soup is an example of a good first meal. Heavy or fried foods are harder to digest and may make you feel nauseous or bloated. Drink plenty of fluids but you should avoid alcoholic beverages for 24 hours. If you had an esophageal dilation, please see attached information for diet.   ACTIVITY: Your care partner should take you home directly after the procedure. You should plan to take it easy, moving slowly for the rest of the day. You can resume normal activity the day after the procedure however YOU SHOULD NOT DRIVE, use power tools, machinery or perform tasks that involve climbing or major physical exertion for 24 hours (because of the sedation medicines used during the test).   SYMPTOMS TO REPORT IMMEDIATELY: A gastroenterologist can be reached at any hour. Please call [phone]  for any of the following symptoms:  Following lower endoscopy (colonoscopy, flexible sigmoidoscopy) Excessive amounts of blood in the stool  Significant tenderness, worsening of abdominal pains  Swelling of the abdomen that is new, acute  Fever of  100 or higher  Following upper endoscopy (EGD, EUS, ERCP, esophageal dilation) Vomiting of blood or coffee ground material  New, significant abdominal pain  New, significant chest pain or pain under the shoulder blades  Painful or persistently difficult swallowing  New shortness of breath  Black, tarry-looking or red, bloody stools  FOLLOW UP:  If any biopsies were taken you will be contacted by phone or by letter within the next 1-3 weeks. Call [phone]  if you have not heard about the biopsies in 3 weeks.  Please also call with any specific questions about appointments or follow up tests. YOU HAD AN ENDOSCOPIC PROCEDURE TODAY: Refer to the procedure report and other information in the discharge instructions given to you for any specific questions about what was found during the examination. If this information does not answer your questions, please call [facility] at [phone] to clarify.   YOU SHOULD EXPECT: Some feelings of bloating in the abdomen. Passage of more gas than usual. Walking can help get rid of the air that was put into your GI tract during the procedure and reduce the bloating. If you had a lower endoscopy (such as a colonoscopy or flexible sigmoidoscopy) you may notice spotting of blood in your stool or on the toilet paper. Some abdominal soreness may be present for a day or two, also.  DIET: Your first meal following the procedure should be a light meal and then it is ok to progress to your normal diet. A half-sandwich or bowl of soup is an example   of a good first meal. Heavy or fried foods are harder to digest and may make you feel nauseous or bloated. Drink plenty of fluids but you should avoid alcoholic beverages for 24 hours. If you had an esophageal dilation, please see attached information for diet.   ACTIVITY: Your care partner should take you home directly after the procedure. You should plan to take it easy, moving slowly for the rest of the day. You  can resume normal activity the day after the procedure however YOU SHOULD NOT DRIVE, use power tools, machinery or perform tasks that involve climbing or major physical exertion for 24 hours (because of the sedation medicines used during the test).   SYMPTOMS TO REPORT IMMEDIATELY: A gastroenterologist can be reached at any hour. Please call [phone]  for any of the following symptoms:  Following lower endoscopy (colonoscopy, flexible sigmoidoscopy) Excessive amounts of blood in the stool  Significant tenderness, worsening of abdominal pains  Swelling of the abdomen that is new, acute  Fever of 100 or higher  Following upper endoscopy (EGD, EUS, ERCP, esophageal dilation) Vomiting of blood or coffee ground material  New, significant abdominal pain  New, significant chest pain or pain under the shoulder blades  Painful or persistently difficult swallowing  New shortness of breath  Black, tarry-looking or red, bloody stools  FOLLOW UP:  If any biopsies were taken you will be contacted by phone or by letter within the next 1-3 weeks. Call [phone]  if you have not heard about the biopsies in 3 weeks.  Please also call with any specific questions about appointments or follow up tests.  

## 2016-12-04 NOTE — Transfer of Care (Signed)
Immediate Anesthesia Transfer of Care Note  Patient: Heather Santos  Procedure(s) Performed: Procedure(s): COLONOSCOPY WITH PROPOFOL (N/A)  Patient Location: PACU  Anesthesia Type:MAC  Level of Consciousness: awake, alert  and oriented  Airway & Oxygen Therapy: Patient Spontanous Breathing and Patient connected to face mask oxygen  Post-op Assessment: Report given to RN and Post -op Vital signs reviewed and stable  Post vital signs: Reviewed and stable  Last Vitals:  Vitals:   12/04/16 0626  BP: (!) 160/63  Pulse: 61  Resp: 14  Temp: 36.8 C    Last Pain:  Vitals:   12/04/16 0626  TempSrc: Oral         Complications: No apparent anesthesia complications

## 2016-12-04 NOTE — H&P (Signed)
Heather Santos is an 71 y.o. female.   Chief Complaint: Colorectal cancer screening. HPI: 71 year old white female here for a screening colonoscopy. See office notes for details.   Past Medical History:  Diagnosis Date  . Fatty liver 10/13   on U/S w elevated LFTs  . History of shingles   . Hypertension   . Major depression in remission (HCC)   . Obesity   . OSA (obstructive sleep apnea)    w CPAP AHI 35/hr now on CPAP at 16 cm H2O  . Osteoporosis    started prolia 2012   Past Surgical History:  Procedure Laterality Date  . HERNIA REPAIR     Family History  Problem Relation Age of Onset  . CAD Father    Social History:  reports that she has never smoked. She has never used smokeless tobacco. She reports that she drinks alcohol. She reports that she does not use drugs.  Allergies:  Allergies  Allergen Reactions  . Augmentin [Amoxicillin-Pot Clavulanate] Other (See Comments)    Vomiting  . Sulfa Antibiotics Other (See Comments)    Childhood allergy when she was 852-72 years old, unsure of what happened    Medications Prior to Admission  Medication Sig Dispense Refill  . Calcium Carbonate-Vitamin D (CALCIUM PLUS VITAMIN D PO) Take 1-2 tablets by mouth See admin instructions. Takes 2 tablets by mouth once daily then alternates the next day and takes 2 tabs and 1 tab in the evening    . Cholecalciferol (VITAMIN D) 400 UNIT/ML LIQD Take 400 Units by mouth daily.     . Cholecalciferol (VITAMIN D3) 1000 UNIT/SPRAY LIQD Take 1,000 Units by mouth daily.    . Multiple Vitamin (MULTIVITAMIN) tablet Take 1 tablet by mouth daily.    Marland Kitchen. denosumab (PROLIA) 60 MG/ML SOLN injection Inject 60 mg into the skin every 6 (six) months. Administer in upper arm, thigh, or abdomen     No results found for this or any previous visit (from the past 48 hour(s)). No results found.  Review of Systems  Constitutional: Negative.   HENT: Negative.   Eyes: Negative.   Respiratory: Negative.    Cardiovascular: Negative.   Gastrointestinal: Negative.   Genitourinary: Negative.   Musculoskeletal: Negative.   Skin: Negative.   Neurological: Negative.   Endo/Heme/Allergies: Negative.   Psychiatric/Behavioral: Negative.    Blood pressure (!) 160/63, pulse 61, temperature 98.3 F (36.8 C), temperature source Oral, resp. rate 14, height 5\' 5"  (1.651 m), weight 87.7 kg (193 lb 5 oz), SpO2 98 %. Physical Exam  Constitutional: She is oriented to person, place, and time. She appears well-developed and well-nourished.  HENT:  Head: Normocephalic and atraumatic.  Eyes: Conjunctivae and EOM are normal. Pupils are equal, round, and reactive to light.  Neck: Normal range of motion. Neck supple.  Cardiovascular: Normal rate and regular rhythm.   GI: Soft. Bowel sounds are normal.  Neurological: She is alert and oriented to person, place, and time.  Skin: Skin is warm and dry.  Psychiatric: She has a normal mood and affect. Her behavior is normal. Judgment and thought content normal.    Assessment/Plan Colorectal cancer screening: proceed with a colonoscopy at this time. Iyauna Sing, MD 12/04/2016, 7:22 AM

## 2016-12-04 NOTE — Op Note (Signed)
[facility] Patient Name: Heather Santos Procedure Date: 12/04/2016 MRN: [id number] Attending MD: Juanita Craver , MD Date of Birth: 1946-01-10 CSN: [id number] Age: 71 Admit Type: Outpatient Procedure:                Screening colonoscopy. Indications:              CRC screening for colorectal malignant neoplasm. Providers:                Juanita Craver, MD, Elmer Ramp. Tilden Dome, RN, William Dalton, Technician, [employer], Immunologist. Referring MD:             Mayra Neer, MD Medicines:                Monitored Anesthesia Care. Complications:            No immediate complications. Estimated Blood Loss:     Estimated blood loss: none. Procedure:                Pre-anesthesia assessment: - Prior to the                            procedure, a history and physical was performed,                            and patient medications and allergies were                            reviewed. The patient's tolerance of previous                            anesthesia was also reviewed. The risks and                            benefits of the procedure and the sedation options                            and risks were discussed with the patient. All                            questions were answered, and informed consent was                            obtained. Prior anticoagulants: The patient has                            taken no previous anticoagulant or antiplatelet                            agents. ASA Grade Assessment: II - A patient with                            mild systemic disease. After reviewing the risks  and benefits, the patient was deemed in                            satisfactory condition to undergo the procedure.                            After obtaining informed consent, the colonoscope                            was passed under direct vision. Throughout the                            procedure, the patient's blood  pressure, pulse, and                            oxygen saturations were monitored continuously. The                            EC-[id number] ([id number]) scope was introduced through                            the anus and advanced to the the cecum, identified                            by appendiceal orifice and ileocecal valve. The                            colonoscopy was performed without difficulty. The                            patient tolerated the procedure well. The quality                            of the bowel preparation was good. The colonoscopy                            was performed without difficulty. The bowel                            preparation used was GoLYTELY. The ileocecal valve,                            the appendiceal orifice and the rectum were                            photographed. Scope In: 7:33:05 AM Scope Out: 7:48:08 AM Scope Withdrawal Time: 0 hours 6 minutes 7 seconds  Total Procedure Duration: 0 hours 15 minutes 3 seconds  Findings:      The entire examined portion of the colon appeared normal.      Small internal hemorrhoids were noted on retroflexion. Impression:               - The entire examined colon is normal.                           -  Small internal hemorrhoids.                           - No specimens collected. Moderate Sedation:      MAC used. Recommendation:           - High fiber diet with augmented water consumption                            daily.                           - Continue present medications.                           - Repeat colonoscopy/Cologaurd stool DNA test in 10                            years for surveillance.                           - Return to GI office PRN.                           - If the patient has any abnormal GI symptoms in                            the interim, she has been advised to call the                            office ASAP for further recommendations. Procedure Code(s):        ---  Professional ---                           G2694, Colorectal cancer screening; colonoscopy on                            individual not meeting criteria for high risk Diagnosis Code(s):        --- Professional ---                           Z12.11, Encounter for screening for malignant                            neoplasm of colon CPT copyright 2016 American Medical Association. All rights reserved. The codes documented in this report are preliminary and upon coder review may  be revised to meet current compliance requirements. Juanita Craver, MD Juanita Craver, MD 12/04/2016 8:00:53 AM This report has been signed electronically. Number of Addenda: 0

## 2016-12-04 NOTE — Anesthesia Preprocedure Evaluation (Signed)
Anesthesia Evaluation  Patient identified by MRN, date of birth, ID band Patient awake    Reviewed: Allergy & Precautions, NPO status , Patient's Chart, lab work & pertinent test results  Airway Mallampati: II  TM Distance: >3 FB Neck ROM: Full    Dental no notable dental hx.    Pulmonary sleep apnea and Continuous Positive Airway Pressure Ventilation ,    Pulmonary exam normal breath sounds clear to auscultation       Cardiovascular hypertension, negative cardio ROS Normal cardiovascular exam Rhythm:Regular Rate:Normal     Neuro/Psych negative neurological ROS  negative psych ROS   GI/Hepatic negative GI ROS, Neg liver ROS,   Endo/Other  negative endocrine ROS  Renal/GU negative Renal ROS  negative genitourinary   Musculoskeletal negative musculoskeletal ROS (+)   Abdominal   Peds negative pediatric ROS (+)  Hematology negative hematology ROS (+)   Anesthesia Other Findings   Reproductive/Obstetrics negative OB ROS                             Anesthesia Physical Anesthesia Plan  ASA: II  Anesthesia Plan: MAC   Post-op Pain Management:    Induction: Intravenous  PONV Risk Score and Plan: 2 and Treatment may vary due to age  Airway Management Planned:   Additional Equipment:   Intra-op Plan:   Post-operative Plan:   Informed Consent: I have reviewed the patients History and Physical, chart, labs and discussed the procedure including the risks, benefits and alternatives for the proposed anesthesia with the patient or authorized representative who has indicated his/her understanding and acceptance.   Dental advisory given  Plan Discussed with: CRNA  Anesthesia Plan Comments:         Anesthesia Quick Evaluation

## 2016-12-05 ENCOUNTER — Encounter (HOSPITAL_COMMUNITY): Payer: Self-pay | Admitting: Gastroenterology

## 2017-03-11 DIAGNOSIS — Z23 Encounter for immunization: Secondary | ICD-10-CM | POA: Diagnosis not present

## 2017-04-03 DIAGNOSIS — J069 Acute upper respiratory infection, unspecified: Secondary | ICD-10-CM | POA: Diagnosis not present

## 2017-04-08 DIAGNOSIS — M810 Age-related osteoporosis without current pathological fracture: Secondary | ICD-10-CM | POA: Diagnosis not present

## 2017-04-08 DIAGNOSIS — E559 Vitamin D deficiency, unspecified: Secondary | ICD-10-CM | POA: Diagnosis not present

## 2017-04-08 DIAGNOSIS — E669 Obesity, unspecified: Secondary | ICD-10-CM | POA: Diagnosis not present

## 2017-04-08 DIAGNOSIS — I10 Essential (primary) hypertension: Secondary | ICD-10-CM | POA: Diagnosis not present

## 2017-04-08 DIAGNOSIS — G4733 Obstructive sleep apnea (adult) (pediatric): Secondary | ICD-10-CM | POA: Diagnosis not present

## 2017-04-08 DIAGNOSIS — R7301 Impaired fasting glucose: Secondary | ICD-10-CM | POA: Diagnosis not present

## 2017-04-08 DIAGNOSIS — Z0000 Encounter for general adult medical examination without abnormal findings: Secondary | ICD-10-CM | POA: Diagnosis not present

## 2017-04-08 DIAGNOSIS — K7581 Nonalcoholic steatohepatitis (NASH): Secondary | ICD-10-CM | POA: Diagnosis not present

## 2017-05-07 DIAGNOSIS — M810 Age-related osteoporosis without current pathological fracture: Secondary | ICD-10-CM | POA: Diagnosis not present

## 2017-05-14 DIAGNOSIS — M8589 Other specified disorders of bone density and structure, multiple sites: Secondary | ICD-10-CM | POA: Diagnosis not present

## 2017-05-14 DIAGNOSIS — Z1231 Encounter for screening mammogram for malignant neoplasm of breast: Secondary | ICD-10-CM | POA: Diagnosis not present

## 2017-05-17 DIAGNOSIS — R921 Mammographic calcification found on diagnostic imaging of breast: Secondary | ICD-10-CM | POA: Diagnosis not present

## 2017-05-28 DIAGNOSIS — Z961 Presence of intraocular lens: Secondary | ICD-10-CM | POA: Diagnosis not present

## 2017-05-28 DIAGNOSIS — H5203 Hypermetropia, bilateral: Secondary | ICD-10-CM | POA: Diagnosis not present

## 2017-05-28 DIAGNOSIS — H02834 Dermatochalasis of left upper eyelid: Secondary | ICD-10-CM | POA: Diagnosis not present

## 2017-05-28 DIAGNOSIS — H02831 Dermatochalasis of right upper eyelid: Secondary | ICD-10-CM | POA: Diagnosis not present

## 2017-08-28 DIAGNOSIS — H02834 Dermatochalasis of left upper eyelid: Secondary | ICD-10-CM | POA: Diagnosis not present

## 2017-08-28 DIAGNOSIS — H1851 Endothelial corneal dystrophy: Secondary | ICD-10-CM | POA: Diagnosis not present

## 2017-08-28 DIAGNOSIS — H02831 Dermatochalasis of right upper eyelid: Secondary | ICD-10-CM | POA: Diagnosis not present

## 2017-09-02 ENCOUNTER — Ambulatory Visit: Payer: Medicare Other | Admitting: Cardiology

## 2017-09-06 ENCOUNTER — Ambulatory Visit (INDEPENDENT_AMBULATORY_CARE_PROVIDER_SITE_OTHER): Payer: Medicare Other | Admitting: Cardiology

## 2017-09-06 ENCOUNTER — Encounter: Payer: Self-pay | Admitting: Cardiology

## 2017-09-06 VITALS — BP 161/75 | HR 64 | Ht 65.0 in | Wt 193.2 lb

## 2017-09-06 DIAGNOSIS — I10 Essential (primary) hypertension: Secondary | ICD-10-CM

## 2017-09-06 DIAGNOSIS — G4733 Obstructive sleep apnea (adult) (pediatric): Secondary | ICD-10-CM

## 2017-09-06 NOTE — Patient Instructions (Signed)
Medication Instructions:  Your physician recommends that you continue on your current medications as directed. Please refer to the Current Medication list given to you today.  If you need a refill on your cardiac medications, please contact your pharmacy first.  Labwork: None ordered   Testing/Procedures: None ordered   Follow-Up: Your physician wants you to follow-up in: 1 year with Dr. Turner. You will receive a reminder letter in the mail two months in advance. If you don't receive a letter, please call our office to schedule the follow-up appointment.  Any Other Special Instructions Will Be Listed Below (If Applicable).   Thank you for choosing [facility]    Rena Jamel Holzmann, RN  [phone]  If you need a refill on your cardiac medications before your next appointment, please call your pharmacy.   

## 2017-09-06 NOTE — Progress Notes (Signed)
Cardiology Office Note:    Date:  09/06/2017   ID:  Heather NannyBetty A Kincer, DOB 06/08/1946, MRN [id number]  PCP:  Lupita RaiderShaw, Kimberlee, MD  Cardiologist:  No primary care provider on file.    Referring MD: Lupita RaiderShaw, Kimberlee, MD   Chief Complaint  Patient presents with  . Sleep Apnea  . Hypertension    History of Present Illness:    Heather Santos is a 72 y.o. female with a hx of  severe OSA on CPAP, HTN and obesity.  She is doing well with her CPAP device.  She tolerates the nasal pillow mask and feels the pressure is adequate.  Since going on CPAP she feels rested in the am and has no significant daytime sleepiness.  She denies any significant mouth or nasal dryness or nasal congestion.  Occasionlly her eyes will get dry.  She does not think that she snores.    Past Medical History:  Diagnosis Date  . Fatty liver 10/13   on U/S w elevated LFTs  . History of shingles   . Hypertension   . Major depression in remission (HCC)   . Obesity   . OSA (obstructive sleep apnea)    w CPAP AHI 35/hr now on CPAP at 16 cm H2O  . Osteoporosis    started prolia 2012    Past Surgical History:  Procedure Laterality Date  . COLONOSCOPY WITH PROPOFOL N/A 12/04/2016   Procedure: COLONOSCOPY WITH PROPOFOL;  Surgeon: Charna ElizabethMann, Jyothi, MD;  Location: [facility];  Service: Endoscopy;  Laterality: N/A;  . HERNIA REPAIR      Current Medications: Current Meds  Medication Sig  . Calcium Carbonate-Vitamin D (CALCIUM PLUS VITAMIN D PO) Take 1-2 tablets by mouth See admin instructions. Takes 2 tablets by mouth once daily then alternates the next day and takes 2 tabs and 1 tab in the evening  . Cholecalciferol (VITAMIN D) 400 UNIT/ML LIQD Take 400 Units by mouth daily.   . Cholecalciferol (VITAMIN D3) 1000 UNIT/SPRAY LIQD Take 1,000 Units by mouth daily.  Marland Kitchen. denosumab (PROLIA) 60 MG/ML SOLN injection Inject 60 mg into the skin every 6 (six) months. Administer in upper arm, thigh, or abdomen  . Multiple Vitamin (MULTIVITAMIN)  tablet Take 1 tablet by mouth daily.     Allergies:   Augmentin [amoxicillin-pot clavulanate] and Sulfa antibiotics   Social History   Socioeconomic History  . Marital status: Married    Spouse name: None  . Number of children: None  . Years of education: None  . Highest education level: None  Social Needs  . Financial resource strain: None  . Food insecurity - worry: None  . Food insecurity - inability: None  . Transportation needs - medical: None  . Transportation needs - non-medical: None  Occupational History  . None  Tobacco Use  . Smoking status: Never Smoker  . Smokeless tobacco: Never Used  Substance and Sexual Activity  . Alcohol use: Yes    Comment: socailly  . Drug use: No  . Sexual activity: None  Other Topics Concern  . None  Social History Narrative  . None     Family History: The patient's family history includes CAD in her father.  ROS:   Please see the history of present illness.    ROS  All other systems reviewed and negative.   EKGs/Labs/Other Studies Reviewed:    The following studies were reviewed today: PAP download  EKG:  EKG is not ordered today.    Recent Labs:  No results found for requested labs within last 8760 hours.   Recent Lipid Panel No results found for: CHOL, TRIG, HDL, CHOLHDL, VLDL, LDLCALC, LDLDIRECT  Physical Exam:    VS:  BP (!) 161/75   Pulse 64   Ht 5\' 5"  (1.651 m)   Wt 193 lb 3.2 oz (87.6 kg)   BMI 32.15 kg/m     Wt Readings from Last 3 Encounters:  09/06/17 193 lb 3.2 oz (87.6 kg)  12/04/16 193 lb 5 oz (87.7 kg)  05/03/16 199 lb 12.8 oz (90.6 kg)     GEN:  Well nourished, well developed in no acute distress HEENT: Normal NECK: No JVD; No carotid bruits LYMPHATICS: No lymphadenopathy CARDIAC: RRR, no murmurs, rubs, gallops RESPIRATORY:  Clear to auscultation without rales, wheezing or rhonchi  ABDOMEN: Soft, non-tender, non-distended MUSCULOSKELETAL:  No edema; No deformity  SKIN: Warm and  dry NEUROLOGIC:  Alert and oriented x 3 PSYCHIATRIC:  Normal affect   ASSESSMENT:    1. Obstructive sleep apnea   2. Essential hypertension, benign   3. Class 2 severe obesity due to excess calories with serious comorbidity in adult, unspecified BMI (HCC)    PLAN:    In order of problems listed above:  1.  OSA - the patient is tolerating PAP therapy well without any problems. The PAP download was reviewed today and showed an AHI of 1.9/hr on 10 cm H2O with 96% compliance in using more than 4 hours nightly.  The patient has been using and benefiting from PAP use and will continue to benefit from therapy.   2.  HTN - Bp is borderline controlled on exam today but at home her BP is 125-166mmHg systolic.  She is currently on no antihypertensive meds.   3.  Obesity - she has a Systems analyst twice weekly and goes 2 other days a week to the gym.  She is working hard avoiding carbs and desserts.    Medication Adjustments/Labs and Tests Ordered: Current medicines are reviewed at length with the patient today.  Concerns regarding medicines are outlined above.  No orders of the defined types were placed in this encounter.  No orders of the defined types were placed in this encounter.   Signed, Armanda Magic, MD  09/06/2017 8:36 AM    [facility]

## 2017-10-07 DIAGNOSIS — E669 Obesity, unspecified: Secondary | ICD-10-CM | POA: Diagnosis not present

## 2017-10-07 DIAGNOSIS — I10 Essential (primary) hypertension: Secondary | ICD-10-CM | POA: Diagnosis not present

## 2017-10-07 DIAGNOSIS — R7301 Impaired fasting glucose: Secondary | ICD-10-CM | POA: Diagnosis not present

## 2017-10-07 DIAGNOSIS — K7581 Nonalcoholic steatohepatitis (NASH): Secondary | ICD-10-CM | POA: Diagnosis not present

## 2017-10-07 DIAGNOSIS — Z6832 Body mass index (BMI) 32.0-32.9, adult: Secondary | ICD-10-CM | POA: Diagnosis not present

## 2017-11-05 DIAGNOSIS — M810 Age-related osteoporosis without current pathological fracture: Secondary | ICD-10-CM | POA: Diagnosis not present

## 2017-11-14 DIAGNOSIS — R921 Mammographic calcification found on diagnostic imaging of breast: Secondary | ICD-10-CM | POA: Diagnosis not present

## 2017-11-20 DIAGNOSIS — J329 Chronic sinusitis, unspecified: Secondary | ICD-10-CM | POA: Diagnosis not present

## 2018-01-10 DIAGNOSIS — R300 Dysuria: Secondary | ICD-10-CM | POA: Diagnosis not present

## 2018-04-01 IMAGING — CR DG FOOT COMPLETE 3+V*L*
3 series · 3 of 3 positions shown · non-contrast
Comparison: None.

CLINICAL DATA: Left foot injury 1 month ago. Persistent pain in the
third toe and third metatarsal area.

EXAM:
LEFT FOOT - COMPLETE 3+ VIEW

[t foot ap left]
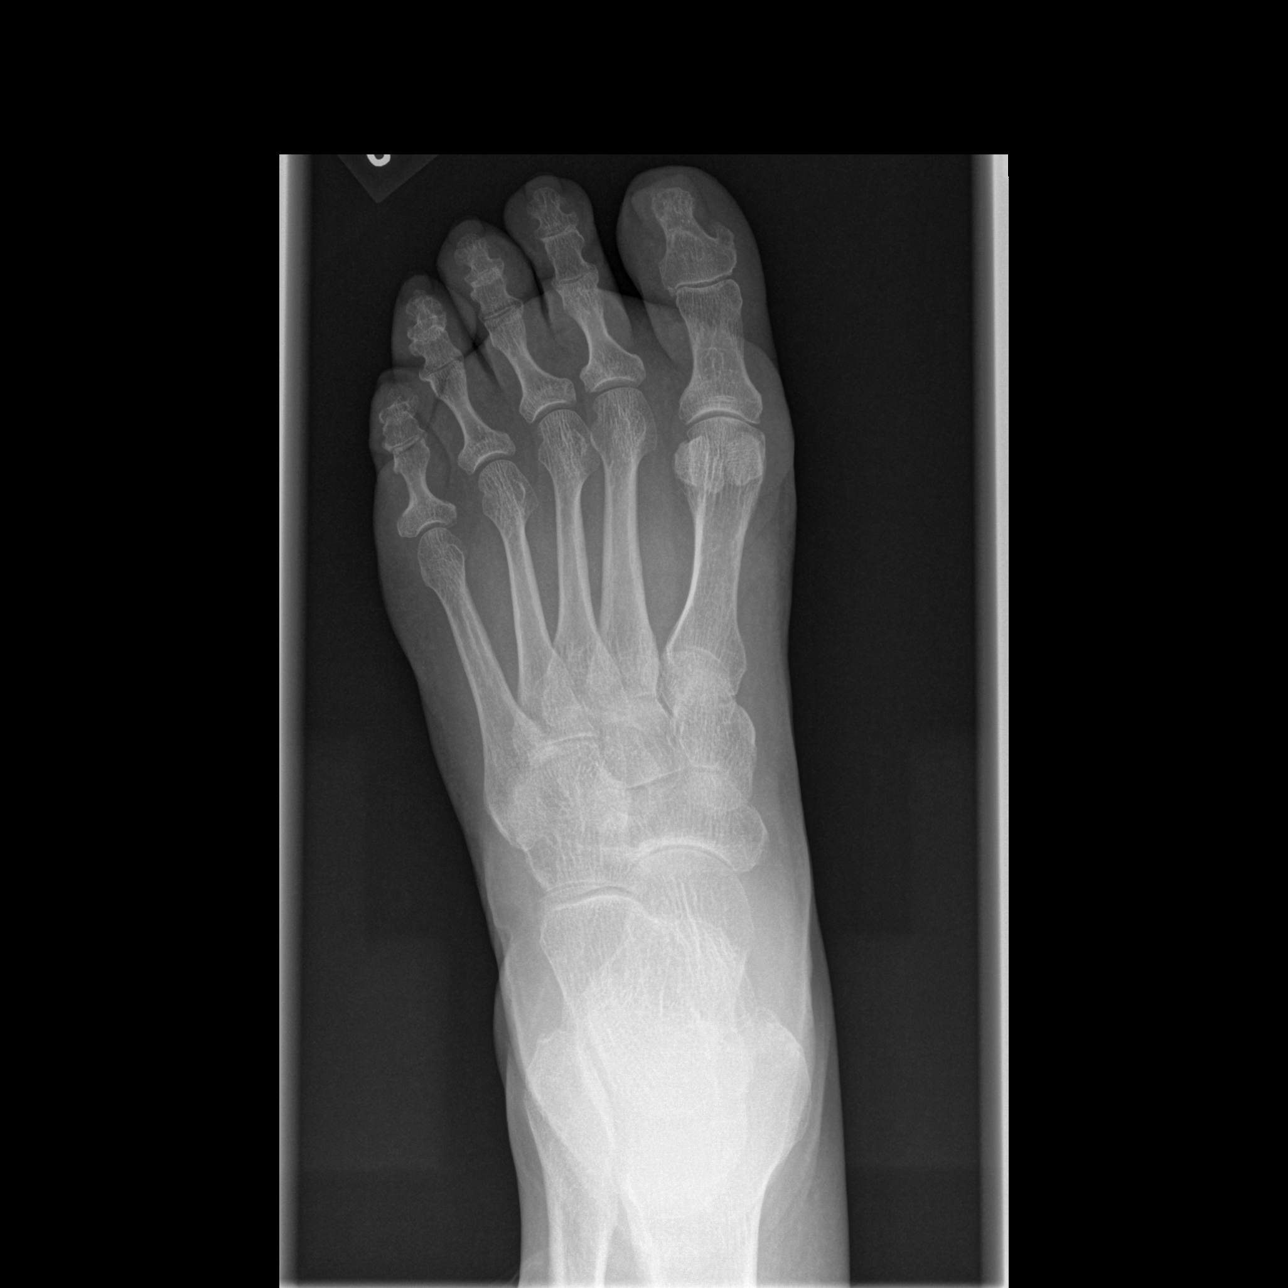

[t foot oblique left]
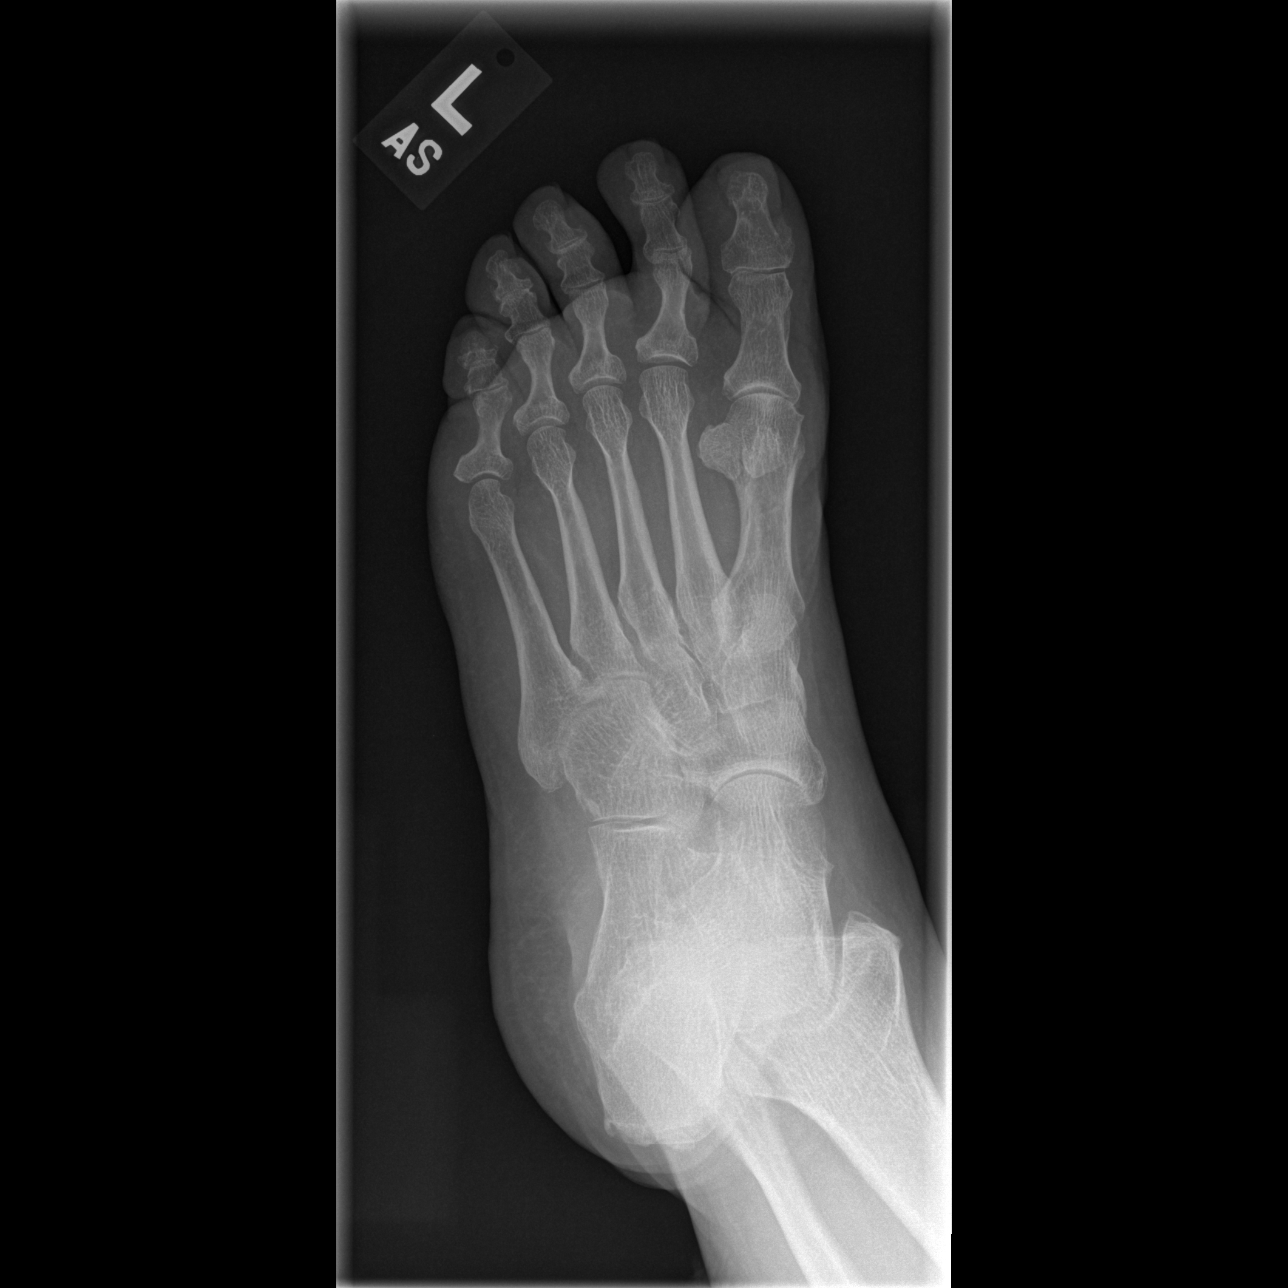

[t foot lat left]
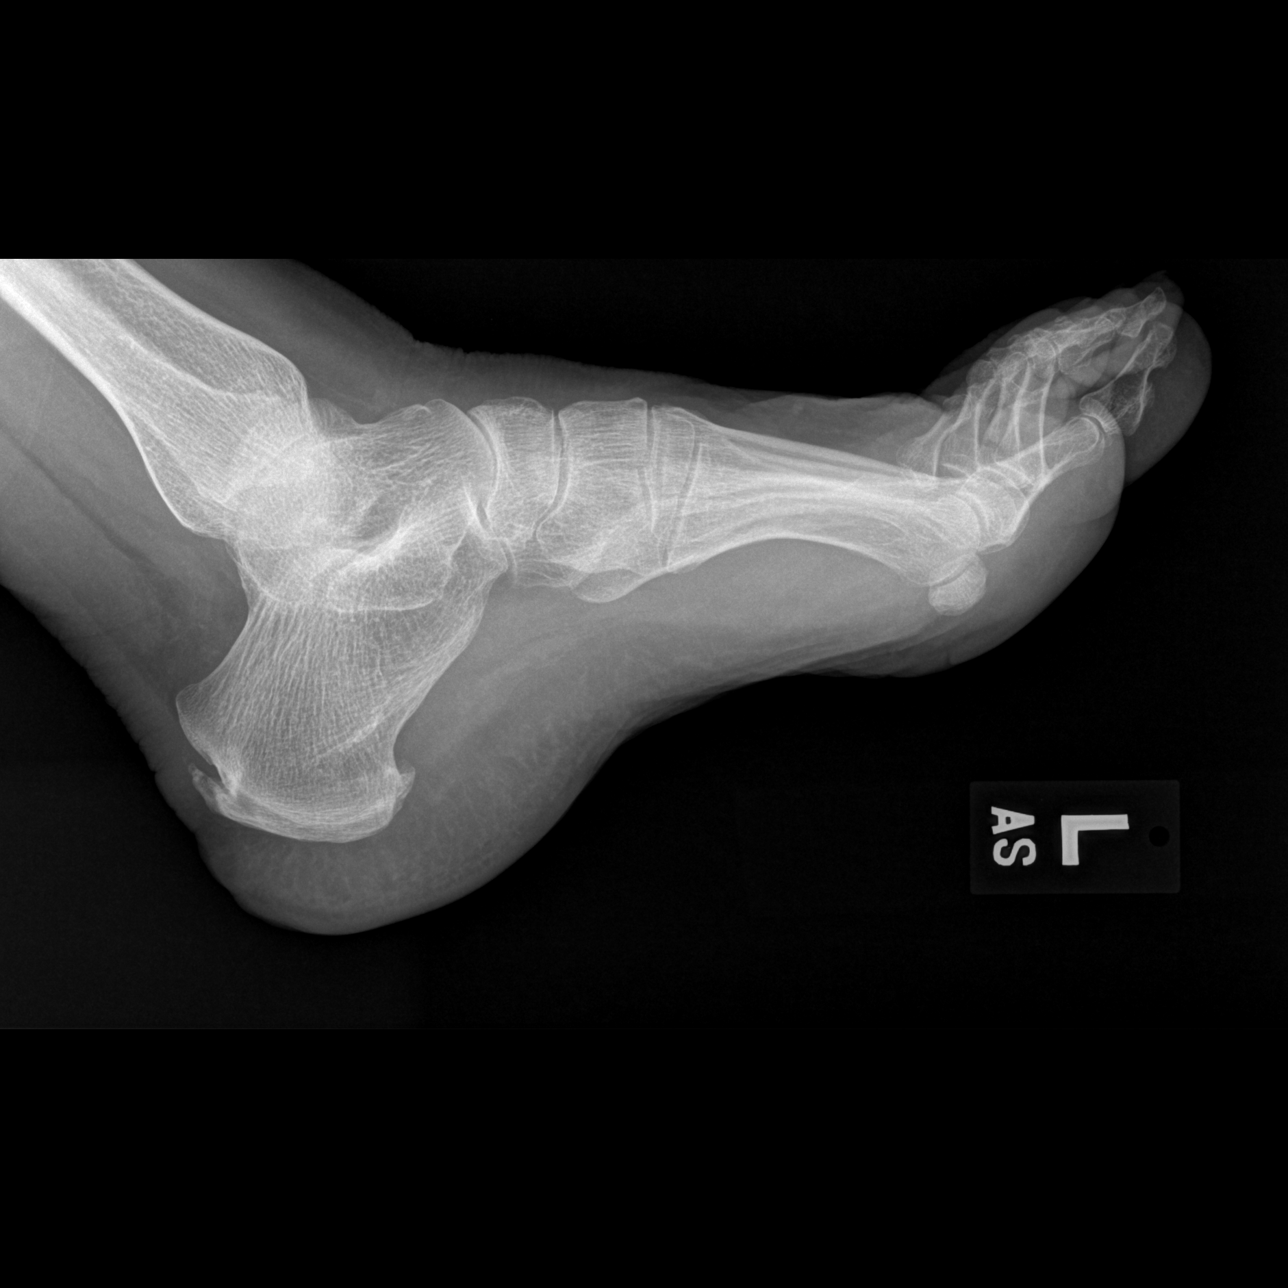

[3 of 3 positions shown; findings below may reference images not displayed]

FINDINGS: DIP and PIP joint degenerative changes are noted. No fracture or
destructive bony lesions. The metatarsal phalangeal joints are
maintained and the midfoot joint spaces are maintained. Sinai Hoffmann is
noted along with calcaneal heel spurs.
IMPRESSION: No acute bony findings.

DIP and PIP joint degenerative changes.

Sinai Hoffmann and calcaneal spurring changes.

## 2018-04-14 DIAGNOSIS — Z23 Encounter for immunization: Secondary | ICD-10-CM | POA: Diagnosis not present

## 2018-04-14 DIAGNOSIS — I10 Essential (primary) hypertension: Secondary | ICD-10-CM | POA: Diagnosis not present

## 2018-04-14 DIAGNOSIS — K7581 Nonalcoholic steatohepatitis (NASH): Secondary | ICD-10-CM | POA: Diagnosis not present

## 2018-04-14 DIAGNOSIS — E559 Vitamin D deficiency, unspecified: Secondary | ICD-10-CM | POA: Diagnosis not present

## 2018-04-14 DIAGNOSIS — Z6832 Body mass index (BMI) 32.0-32.9, adult: Secondary | ICD-10-CM | POA: Diagnosis not present

## 2018-04-14 DIAGNOSIS — E669 Obesity, unspecified: Secondary | ICD-10-CM | POA: Diagnosis not present

## 2018-04-14 DIAGNOSIS — M810 Age-related osteoporosis without current pathological fracture: Secondary | ICD-10-CM | POA: Diagnosis not present

## 2018-04-14 DIAGNOSIS — R7301 Impaired fasting glucose: Secondary | ICD-10-CM | POA: Diagnosis not present

## 2018-04-14 DIAGNOSIS — G4733 Obstructive sleep apnea (adult) (pediatric): Secondary | ICD-10-CM | POA: Diagnosis not present

## 2018-04-14 DIAGNOSIS — Z0000 Encounter for general adult medical examination without abnormal findings: Secondary | ICD-10-CM | POA: Diagnosis not present

## 2018-05-09 DIAGNOSIS — M810 Age-related osteoporosis without current pathological fracture: Secondary | ICD-10-CM | POA: Diagnosis not present

## 2018-05-19 DIAGNOSIS — I10 Essential (primary) hypertension: Secondary | ICD-10-CM | POA: Diagnosis not present

## 2018-05-19 DIAGNOSIS — J019 Acute sinusitis, unspecified: Secondary | ICD-10-CM | POA: Diagnosis not present

## 2018-05-20 DIAGNOSIS — R921 Mammographic calcification found on diagnostic imaging of breast: Secondary | ICD-10-CM | POA: Diagnosis not present

## 2018-06-09 DIAGNOSIS — H35363 Drusen (degenerative) of macula, bilateral: Secondary | ICD-10-CM | POA: Diagnosis not present

## 2018-06-09 DIAGNOSIS — Z961 Presence of intraocular lens: Secondary | ICD-10-CM | POA: Diagnosis not present

## 2018-06-09 DIAGNOSIS — H1851 Endothelial corneal dystrophy: Secondary | ICD-10-CM | POA: Diagnosis not present

## 2018-06-12 DIAGNOSIS — E559 Vitamin D deficiency, unspecified: Secondary | ICD-10-CM | POA: Diagnosis not present

## 2018-10-15 DIAGNOSIS — R7301 Impaired fasting glucose: Secondary | ICD-10-CM | POA: Diagnosis not present

## 2018-10-15 DIAGNOSIS — E669 Obesity, unspecified: Secondary | ICD-10-CM | POA: Diagnosis not present

## 2018-10-15 DIAGNOSIS — Z6832 Body mass index (BMI) 32.0-32.9, adult: Secondary | ICD-10-CM | POA: Diagnosis not present

## 2018-10-15 DIAGNOSIS — I10 Essential (primary) hypertension: Secondary | ICD-10-CM | POA: Diagnosis not present

## 2018-10-15 DIAGNOSIS — M810 Age-related osteoporosis without current pathological fracture: Secondary | ICD-10-CM | POA: Diagnosis not present

## 2018-10-15 DIAGNOSIS — E559 Vitamin D deficiency, unspecified: Secondary | ICD-10-CM | POA: Diagnosis not present

## 2018-10-20 ENCOUNTER — Telehealth: Payer: Self-pay | Admitting: Cardiology

## 2018-10-20 NOTE — Telephone Encounter (Signed)
10/20/18 unable to reach to set up [email or web address]

## 2018-10-20 NOTE — Telephone Encounter (Signed)
Patient is returning call regarding appt 

## 2018-10-21 NOTE — Telephone Encounter (Signed)
Spoke with pt and she has given consent for a Doxy.me visit with Dr. Mayford Knife. Pt has been advised to have BP, HR, and weight ready for appt.      YOUR CARDIOLOGY TEAM HAS ARRANGED FOR AN E-VISIT FOR YOUR APPOINTMENT - PLEASE REVIEW IMPORTANT INFORMATION BELOW SEVERAL DAYS PRIOR TO YOUR APPOINTMENT  Due to the recent COVID-19 pandemic, we are transitioning in-person office visits to tele-medicine visits in an effort to decrease unnecessary exposure to our patients and staff. Medicare and most insurances are covering these visits without a copay needed. You will need a working email and a smartphone or computer with a camera and microphone. For patients that do not have these items, we can still complete the visit using a telephone but do prefer video when possible. If possible, we also ask that you have a blood pressure cuff and scale at home to measure your blood pressure, heart rate and weight prior to your scheduled appointment. Patients with clinical needs that need an in-person evaluation and testing will still be able to come to the office if absolutely necessary. If you have any questions, feel free to call our office.     DOWNLOADING THE SOFTWARE  Download the [employer] app to enable video and telephone visits with your [facility] Provider.   Instructions for downloading [employer] WebEx: - Go to [email or web address]://www.webex.com/downloads.html and follow the instructions, or download the app on your smartphone [facility] [employer] Meetings). - If you have technical difficulties with downloading WebEx, please call WebEx at [phone]. - Once the app is downloaded (can be done on either mobile or desktop computer), go to Settings in the upper left hand corner.  Be sure that camera and audio are enabled.  - You will receive an email message with a link to the meeting with a time to join for your tele-health visit.  - Please download the app and have settings configured prior to the appointment time.       2-3 DAYS BEFORE YOUR APPOINTMENT  One of our staff will call you to confirm that you have been able to set up your WebEx account. We will remind you check your blood pressure, heart rate and weight prior to your scheduled appointment. If you have an Apple Watch or Kardia, please upload any pertinent ECG strips the day before or morning of your appointment to MyChart. Our staff will also make sure you have reviewed the consent and agree to move forward with your scheduled tele-health visit.    THE DAY OF YOUR APPOINTMENT  Approximately 15-20 minutes prior to your scheduled appointment, you will receive an e-mail directly from one of our staff member's [email or web address] e-mail accounts inviting you to join a WebEx meeting.  Please do not reply to that email - simply join the [employer].  Upon joining, a member of the office staff will speak with you initially through the WebEx platform to confirm medications, vital signs for the day and any symptoms you may be experiencing.  Please have this information available prior to the time of visit start.      CONSENT FOR TELE-HEALTH VISIT - PLEASE RVIEW  I hereby voluntarily request, consent and authorize [facility] and its employed or contracted physicians, physician assistants, nurse practitioners or other licensed health care professionals (the Practitioner), to provide me with telemedicine health care services (the "Services") as deemed necessary by the treating Practitioner. I acknowledge and consent to receive the Services by the Practitioner via telemedicine. I understand that the  telemedicine visit will involve communicating with the Practitioner through live audiovisual communication technology and the disclosure of certain medical information by electronic transmission. I acknowledge that I have been given the opportunity to request an in-person assessment or other available alternative prior to the telemedicine visit and am voluntarily  participating in the telemedicine visit.  I understand that I have the right to withhold or withdraw my consent to the use of telemedicine in the course of my care at any time, without affecting my right to future care or treatment, and that the Practitioner or I may terminate the telemedicine visit at any time. I understand that I have the right to inspect all information obtained and/or recorded in the course of the telemedicine visit and may receive copies of available information for a reasonable fee.  I understand that some of the potential risks of receiving the Services via telemedicine include:  Marland Kitchen Delay or interruption in medical evaluation due to technological equipment failure or disruption; . Information transmitted may not be sufficient (e.g. poor resolution of images) to allow for appropriate medical decision making by the Practitioner; and/or  . In rare instances, security protocols could fail, causing a breach of personal health information.  Furthermore, I acknowledge that it is my responsibility to provide information about my medical history, conditions and care that is complete and accurate to the best of my ability. I acknowledge that Practitioner's advice, recommendations, and/or decision may be based on factors not within their control, such as incomplete or inaccurate data provided by me or distortions of diagnostic images or specimens that may result from electronic transmissions. I understand that the practice of medicine is not an exact science and that Practitioner makes no warranties or guarantees regarding treatment outcomes. I acknowledge that I will receive a copy of this consent concurrently upon execution via email to the email address I last provided but may also request a printed copy by calling the office of [place].    I understand that my insurance will be billed for this visit.   I have read or had this consent read to me. . I understand the contents of this  consent, which adequately explains the benefits and risks of the Services being provided via telemedicine.  . I have been provided ample opportunity to ask questions regarding this consent and the Services and have had my questions answered to my satisfaction. . I give my informed consent for the services to be provided through the use of telemedicine in my medical care  By participating in this telemedicine visit I agree to the above.

## 2018-10-22 NOTE — Progress Notes (Signed)
Virtual Visit via Video Note   This visit type was conducted due to national recommendations for restrictions regarding the COVID-19 Pandemic (e.g. social distancing) in an effort to limit this patient's exposure and mitigate transmission in our community.  Due to her co-morbid illnesses, this patient is at least at moderate risk for complications without adequate follow up.  This format is felt to be most appropriate for this patient at this time.  All issues noted in this document were discussed and addressed.  A limited physical exam was performed with this format.  Please refer to the patient's chart for her consent to telehealth for [facility].  Evaluation Performed:  Follow-up visit  This visit type was conducted due to national recommendations for restrictions regarding the COVID-19 Pandemic (e.g. social distancing).  This format is felt to be most appropriate for this patient at this time.  All issues noted in this document were discussed and addressed.  No physical exam was performed (except for noted visual exam findings with Video Visits).  Please refer to the patient's chart (MyChart message for video visits and phone note for telephone visits) for the patient's consent to telehealth for [facility].  Date:  10/23/2018   ID:  Heather Santos, DOB 1946/05/06, MRN [id number]  Patient Location:  Home  Provider location:   [place]  PCP:  Lupita Raider, MD  Cardiologist:  Armanda Magic, MD Electrophysiologist:  None   Chief Complaint:  OSA and HTN  History of Present Illness:    Heather Santos is a 73 y.o. female who presents via audio/video conferencing for a telehealth visit today.    Heather Santos is a 74 y.o. female with a hx of severe OSA on CPAP, HTN and obesity.  She is doing well with her CPAP device and thinks that she has gotten used to it.  She tolerates the mask and feels the pressure is adequate.  Since going on CPAP she feels rested in the am and has no  significant daytime sleepiness.  She denies any significant mouth or nasal dryness or nasal congestion.  She does not think that he snores.    The patient does not have symptoms concerning for COVID-19 infection (fever, chills, cough, or new shortness of breath).    Prior CV studies:   The following studies were reviewed today:  PAP compliance report  Past Medical History:  Diagnosis Date  . Fatty liver 10/13   on U/S w elevated LFTs  . History of shingles   . Hypertension   . Major depression in remission (HCC)   . Obesity   . OSA (obstructive sleep apnea)    w CPAP AHI 35/hr now on CPAP at 16 cm H2O  . Osteoporosis    started prolia 2012   Past Surgical History:  Procedure Laterality Date  . COLONOSCOPY WITH PROPOFOL N/A 12/04/2016   Procedure: COLONOSCOPY WITH PROPOFOL;  Surgeon: Charna Elizabeth, MD;  Location: [facility];  Service: Endoscopy;  Laterality: N/A;  . HERNIA REPAIR       Current Meds  Medication Sig  . BIOTIN PO Take 1 tablet by mouth daily.  . Cholecalciferol (VITAMIN D3) 1000 UNIT/SPRAY LIQD Take 1,000 Units by mouth daily.  Marland Kitchen denosumab (PROLIA) 60 MG/ML SOLN injection Inject 60 mg into the skin every 6 (six) months. Administer in upper arm, thigh, or abdomen  . Multiple Vitamin (MULTIVITAMIN) tablet Take 1 tablet by mouth daily.     Allergies:   Augmentin [amoxicillin-pot clavulanate]  and Sulfa antibiotics   Social History   Tobacco Use  . Smoking status: Never Smoker  . Smokeless tobacco: Never Used  Substance Use Topics  . Alcohol use: Yes    Comment: socailly  . Drug use: No     Family Hx: The patient's family history includes CAD in her father.  ROS:   Please see the history of present illness.     All other systems reviewed and are negative.   Labs/Other Tests and Data Reviewed:    Recent Labs: No results found for requested labs within last 8760 hours.   Recent Lipid Panel No results found for: CHOL, TRIG, HDL, CHOLHDL, LDLCALC,  LDLDIRECT  Wt Readings from Last 3 Encounters:  10/23/18 189 lb (85.7 kg)  09/06/17 193 lb 3.2 oz (87.6 kg)  12/04/16 193 lb 5 oz (87.7 kg)     Objective:    Vital Signs:  BP 130/71   Pulse 60   Ht 5\' 5"  (1.651 m)   Wt 189 lb (85.7 kg)   BMI 31.45 kg/m    CONSTITUTIONAL:  Well nourished, well developed female in no acute distress.  EYES: anicteric MOUTH: oral mucosa is pink RESPIRATORY: Normal respiratory effort, symmetric expansion CARDIOVASCULAR: No peripheral edema SKIN: No rash, lesions or ulcers MUSCULOSKELETAL: no digital cyanosis NEURO: Cranial Nerves II-XII grossly intact, moves all extremities PSYCH: Intact judgement and insight.  A&O x 3, Mood/affect appropriate   ASSESSMENT & PLAN:    1.  OSA - the patient is tolerating PAP therapy well without any problems. The PAP download was reviewed today and showed an AHI of 1.6/hr on 10 cm H2O with 90% compliance in using more than 4 hours nightly.  The patient has been using and benefiting from PAP use and will continue to benefit from therapy.   2.  HTN - she checks her BP at home and it has been controlled.  She has not required any antihypertensive meds.   3.  Obesity - I have encouraged her to get into a routine exercise program and cut back on carbs and portions.   COVID-19 Education: The signs and symptoms of COVID-19 were discussed with the patient and how to seek care for testing (follow up with PCP or arrange E-visit).  The importance of social distancing was discussed today.  Patient Risk:   After full review of this patient's clinical status, I feel that they are at least moderate risk at this time.  Time:   Today, I have spent 15 minutes directly with the patient on video discussing medical problems including OSA and treatment and review of compliance data, HTN control and weight loss, diet and exercise.  We also reviewed the symptoms of COVID 19 and the ways to protect against contracting the virus with  telehealth technology.  I spent an additional 5 minutes reviewing patient's chart including PAP compliance report.  Medication Adjustments/Labs and Tests Ordered: Current medicines are reviewed at length with the patient today.  Concerns regarding medicines are outlined above.  Tests Ordered: No orders of the defined types were placed in this encounter.  Medication Changes: No orders of the defined types were placed in this encounter.   Disposition:  Follow up in 1 year(s)  Signed, Armanda Magicraci Ved Martos, MD  10/23/2018 8:30 AM    [facility]

## 2018-10-23 ENCOUNTER — Telehealth (INDEPENDENT_AMBULATORY_CARE_PROVIDER_SITE_OTHER): Payer: Medicare Other | Admitting: Cardiology

## 2018-10-23 ENCOUNTER — Other Ambulatory Visit: Payer: Self-pay

## 2018-10-23 ENCOUNTER — Encounter: Payer: Self-pay | Admitting: Cardiology

## 2018-10-23 VITALS — BP 130/71 | HR 60 | Ht 65.0 in | Wt 189.0 lb

## 2018-10-23 DIAGNOSIS — Z7189 Other specified counseling: Secondary | ICD-10-CM | POA: Diagnosis not present

## 2018-10-23 DIAGNOSIS — I10 Essential (primary) hypertension: Secondary | ICD-10-CM

## 2018-10-23 DIAGNOSIS — G4733 Obstructive sleep apnea (adult) (pediatric): Secondary | ICD-10-CM | POA: Diagnosis not present

## 2018-10-23 NOTE — Patient Instructions (Signed)

## 2019-02-17 DIAGNOSIS — M810 Age-related osteoporosis without current pathological fracture: Secondary | ICD-10-CM | POA: Diagnosis not present

## 2019-02-24 ENCOUNTER — Ambulatory Visit (INDEPENDENT_AMBULATORY_CARE_PROVIDER_SITE_OTHER): Payer: Medicare Other

## 2019-02-24 ENCOUNTER — Other Ambulatory Visit: Payer: Self-pay

## 2019-02-24 ENCOUNTER — Ambulatory Visit (INDEPENDENT_AMBULATORY_CARE_PROVIDER_SITE_OTHER): Payer: Medicare Other | Admitting: Podiatry

## 2019-02-24 ENCOUNTER — Other Ambulatory Visit: Payer: Self-pay | Admitting: Podiatry

## 2019-02-24 VITALS — Temp 97.8°F

## 2019-02-24 DIAGNOSIS — M722 Plantar fascial fibromatosis: Secondary | ICD-10-CM

## 2019-02-24 DIAGNOSIS — M7732 Calcaneal spur, left foot: Secondary | ICD-10-CM

## 2019-02-24 DIAGNOSIS — M79672 Pain in left foot: Secondary | ICD-10-CM

## 2019-02-24 MED ORDER — MELOXICAM 7.5 MG PO TABS: 7.5000 mg | ORAL_TABLET | Freq: Every day | ORAL | 0 refills | Status: DC

## 2019-02-24 NOTE — Patient Instructions (Signed)

## 2019-03-01 NOTE — Progress Notes (Signed)
Subjective:   Patient ID: Heather Santos, female   DOB: 73 y.o.   MRN: [id number]   HPI 73 year old female presents the office today for concerns of pain to the left heel.  She said it hurts in the back as well the bottom of her heel.  There is mild but 1 month.  She states that she has had some issues like this previously and she was 5 x-rays.  She states that she has pain in the morning when she first gets up after being on her feet all day.  She is gone to [employer] ([employer]) which reports new shoes and inserts.  This was somewhat helpful.  No recent injury.  Pain does not wake her up at night.  No weakness or falls.   Review of Systems  All other systems reviewed and are negative.  Past Medical History:  Diagnosis Date  . Fatty liver 10/13   on U/S w elevated LFTs  . History of shingles   . Hypertension   . Major depression in remission ([place])   . Obesity   . OSA (obstructive sleep apnea)    w CPAP AHI 35/hr now on CPAP at 16 cm H2O  . Osteoporosis    started prolia 2012    Past Surgical History:  Procedure Laterality Date  . COLONOSCOPY WITH PROPOFOL N/A 12/04/2016   Procedure: COLONOSCOPY WITH PROPOFOL;  Surgeon: Juanita Craver, MD;  Location: [facility];  Service: Endoscopy;  Laterality: N/A;  . HERNIA REPAIR       Current Outpatient Medications:  .  BIOTIN PO, Take 1 tablet by mouth daily., Disp: , Rfl:  .  Cholecalciferol (VITAMIN D3) 1000 UNIT/SPRAY LIQD, Take 1,000 Units by mouth daily., Disp: , Rfl:  .  denosumab (PROLIA) 60 MG/ML SOLN injection, Inject 60 mg into the skin every 6 (six) months. Administer in upper arm, thigh, or abdomen, Disp: , Rfl:  .  Multiple Vitamin (MULTIVITAMIN) tablet, Take 1 tablet by mouth daily., Disp: , Rfl:  .  meloxicam (MOBIC) 7.5 MG tablet, Take 1 tablet (7.5 mg total) by mouth daily., Disp: 30 tablet, Rfl: 0  Allergies  Allergen Reactions  . Augmentin [Amoxicillin-Pot Clavulanate] Other (See Comments)    Vomiting  . Sulfa Antibiotics Other  (See Comments)    Childhood allergy when she was 21-83 years old, unsure of what happened          Objective:  Physical Exam  General: AAO x3, NAD  Dermatological: Skin is warm, dry and supple bilateral. Nails x 10 are well manicured; remaining integument appears unremarkable at this time. There are no open sores, no preulcerative lesions, no rash or signs of infection present.  Vascular: Dorsalis Pedis artery and Posterior Tibial artery pedal pulses are 2/4 bilateral with immedate capillary fill time. There is no pain with calf compression, swelling, warmth, erythema.   Neruologic: Grossly intact via light touch bilateral.Protective threshold with Semmes Wienstein monofilament intact to all pedal sites bilateral. Negative tinel sign.   Musculoskeletal: There is tenderness palpation on the plantar medial tubercle calcaneus at insertion of plantar fascia on the left side.  Also my discomfort of the posterior aspect the heel.  There is no pain with lateral compression of calcaneus.  Plantar fascial Achilles tendon, flexor, extensor tendons appear to be intact.  Thompson test is negative. muscular strength 5/5 in all groups tested bilateral.  Gait: Unassisted, Nonantalgic.      Assessment:   Left heel pain, plantar fasciitis/achilles tendonitis  Plan:  -Treatment options discussed including all alternatives, risks, and complications -Etiology of symptoms were discussed -X-rays were obtained and reviewed with the patient. No evidence of acute fracture or stress fracture. -Plantar fascial injection performed.  See procedure note below -Prescribed mobic. Discussed side effects of the medication and directed to stop if any are to occur and call the office.  -Plantar fascial brace -Stretching, icing exercises daily.   -Discussed shoe modifications and orthotics  Procedure: Injection Tendon/Ligament Discussed alternatives, risks, complications and verbal consent was obtained.   Location: left plantar fascia at the glabrous junction; medial approach. Skin Prep: Alcohol  Injectate: 0.5cc 0.5% marcaine plain, 0.5 cc 2% lidocaine plain and, 1 cc kenalog 10. Disposition: Patient tolerated procedure well. Injection site dressed with a band-aid.  Post-injection care was discussed and return precautions discussed.   Return in about 3 weeks (around 03/17/2019).  Vivi BarrackMatthew R Cali Cuartas DPM

## 2019-04-21 DIAGNOSIS — R7301 Impaired fasting glucose: Secondary | ICD-10-CM | POA: Diagnosis not present

## 2019-04-21 DIAGNOSIS — E559 Vitamin D deficiency, unspecified: Secondary | ICD-10-CM | POA: Diagnosis not present

## 2019-04-21 DIAGNOSIS — I10 Essential (primary) hypertension: Secondary | ICD-10-CM | POA: Diagnosis not present

## 2019-04-23 DIAGNOSIS — Z6832 Body mass index (BMI) 32.0-32.9, adult: Secondary | ICD-10-CM | POA: Diagnosis not present

## 2019-04-23 DIAGNOSIS — I10 Essential (primary) hypertension: Secondary | ICD-10-CM | POA: Diagnosis not present

## 2019-04-23 DIAGNOSIS — K7581 Nonalcoholic steatohepatitis (NASH): Secondary | ICD-10-CM | POA: Diagnosis not present

## 2019-04-23 DIAGNOSIS — Z7189 Other specified counseling: Secondary | ICD-10-CM | POA: Diagnosis not present

## 2019-04-23 DIAGNOSIS — G4733 Obstructive sleep apnea (adult) (pediatric): Secondary | ICD-10-CM | POA: Diagnosis not present

## 2019-04-23 DIAGNOSIS — E559 Vitamin D deficiency, unspecified: Secondary | ICD-10-CM | POA: Diagnosis not present

## 2019-04-23 DIAGNOSIS — E669 Obesity, unspecified: Secondary | ICD-10-CM | POA: Diagnosis not present

## 2019-04-23 DIAGNOSIS — Z0000 Encounter for general adult medical examination without abnormal findings: Secondary | ICD-10-CM | POA: Diagnosis not present

## 2019-04-23 DIAGNOSIS — R7301 Impaired fasting glucose: Secondary | ICD-10-CM | POA: Diagnosis not present

## 2019-04-23 DIAGNOSIS — M810 Age-related osteoporosis without current pathological fracture: Secondary | ICD-10-CM | POA: Diagnosis not present

## 2019-04-25 DIAGNOSIS — Z23 Encounter for immunization: Secondary | ICD-10-CM | POA: Diagnosis not present

## 2019-05-18 ENCOUNTER — Telehealth: Payer: Self-pay | Admitting: Cardiology

## 2019-05-18 DIAGNOSIS — G4733 Obstructive sleep apnea (adult) (pediatric): Secondary | ICD-10-CM

## 2019-05-18 NOTE — Telephone Encounter (Signed)
New Message:  Patient was having some issues with her CPAP machine. She spoke with [place] and they will need Dr. Radford Pax to submit an order to the company for a new machine. Her machine is not working at all. The company told the patient to have the office write an rx for an Auto CPAP, her pressure setting is 10, and the EPR is set for 3.   The direct number for the company is [phone].  The fax number is [phone]  The company has an open appointment in their [place] office today at 4:00 pm. If the office sends this over today she may be able to leave with a new machine. If the [facility] office does not have anything on site the patient would need to go to [employer]. From the patient's understanding, one of these two offices has a machine onsite for her to take with her after the appointment .

## 2019-05-18 NOTE — Telephone Encounter (Signed)
Order placed to [employer] [facility] via community message and faxed to [phone].

## 2019-05-18 NOTE — Telephone Encounter (Signed)
Please order ResMed CPAP on 10cm H2O with EPR 3 with heated humidity and mask of choice and get a download in 4 weeks

## 2019-06-16 DIAGNOSIS — H524 Presbyopia: Secondary | ICD-10-CM | POA: Diagnosis not present

## 2019-06-16 DIAGNOSIS — Z961 Presence of intraocular lens: Secondary | ICD-10-CM | POA: Diagnosis not present

## 2019-06-16 DIAGNOSIS — H18513 Endothelial corneal dystrophy, bilateral: Secondary | ICD-10-CM | POA: Diagnosis not present

## 2019-07-30 ENCOUNTER — Ambulatory Visit: Payer: Medicare Other

## 2019-08-07 ENCOUNTER — Ambulatory Visit: Payer: Medicare Other

## 2019-08-10 ENCOUNTER — Ambulatory Visit: Payer: Medicare Other

## 2019-10-23 ENCOUNTER — Other Ambulatory Visit: Payer: Self-pay

## 2019-10-23 ENCOUNTER — Telehealth: Payer: Self-pay | Admitting: *Deleted

## 2019-10-23 ENCOUNTER — Telehealth (INDEPENDENT_AMBULATORY_CARE_PROVIDER_SITE_OTHER): Payer: Medicare Other | Admitting: Cardiology

## 2019-10-23 ENCOUNTER — Encounter: Payer: Self-pay | Admitting: Cardiology

## 2019-10-23 VITALS — BP 145/70 | HR 59 | Temp 97.8°F | Ht 65.0 in | Wt 198.0 lb

## 2019-10-23 DIAGNOSIS — G4733 Obstructive sleep apnea (adult) (pediatric): Secondary | ICD-10-CM

## 2019-10-23 DIAGNOSIS — I10 Essential (primary) hypertension: Secondary | ICD-10-CM | POA: Diagnosis not present

## 2019-10-23 NOTE — Telephone Encounter (Signed)
-----   Message from Quintella Reichert, MD sent at 10/23/2019  9:30 AM EDT ----- Order a chin strap and change to [facility]

## 2019-10-23 NOTE — Telephone Encounter (Signed)
Order and referral placed to [facility].

## 2019-10-23 NOTE — Progress Notes (Signed)
Virtual Visit via Telephone Note   This visit type was conducted due to national recommendations for restrictions regarding the COVID-19 Pandemic (e.g. social distancing) in an effort to limit this patient's exposure and mitigate transmission in our community.  Due to her co-morbid illnesses, this patient is at least at moderate risk for complications without adequate follow up.  This format is felt to be most appropriate for this patient at this time.  The patient did not have access to video technology/had technical difficulties with video requiring transitioning to audio format only (telephone).  All issues noted in this document were discussed and addressed.  No physical exam could be performed with this format.  Please refer to the patient's chart for her  consent to telehealth for [facility].   Evaluation Performed:  Follow-up visit  This visit type was conducted due to national recommendations for restrictions regarding the COVID-19 Pandemic (e.g. social distancing).  This format is felt to be most appropriate for this patient at this time.  All issues noted in this document were discussed and addressed.  No physical exam was performed (except for noted visual exam findings with Video Visits).  Please refer to the patient's chart (MyChart message for video visits and phone note for telephone visits) for the patient's consent to telehealth for [facility].  Date:  10/23/2019   ID:  Heather Santos, DOB 07-26-1945, MRN [id number]  Patient Location:  Home  Provider location:   [place]  PCP:  Mayra Neer, MD  Sleep Medicine:  Fransico Him, MD Electrophysiologist:  None   Chief Complaint:  OSA  History of Present Illness:    Heather Santos is a 74 y.o. female who presents via audio/video conferencing for a telehealth visit today.    [name] a 74 y.o.femalewith a hx of severe OSA on CPAP, HTN and obesity.  She is here today for followup and is doing well.  She is  doing well with her CPAP device and thinks that she has gotten used to it.  She tolerates the nasal mask and feels the pressure is adequate.  Since going on CPAP she feels rested in the am and has no significant daytime sleepiness.  She does have some problems with mouth dryness but no significant nasal congestion.  She does not think that he snores.    The patient does not have symptoms concerning for COVID-19 infection (fever, chills, cough, or new shortness of breath).   Prior CV studies:   The following studies were reviewed today:  PAP compliance download  Past Medical History:  Diagnosis Date  . Fatty liver 10/13   on U/S w elevated LFTs  . History of shingles   . Hypertension   . Major depression in remission ([place])   . Obesity   . OSA (obstructive sleep apnea)    w CPAP AHI 35/hr now on CPAP at 16 cm H2O  . Osteoporosis    started prolia 2012   Past Surgical History:  Procedure Laterality Date  . COLONOSCOPY WITH PROPOFOL N/A 12/04/2016   Procedure: COLONOSCOPY WITH PROPOFOL;  Surgeon: Juanita Craver, MD;  Location: [facility];  Service: Endoscopy;  Laterality: N/A;  . HERNIA REPAIR       Current Meds  Medication Sig  . BIOTIN PO Take 2 tablets by mouth daily.   . Cholecalciferol (VITAMIN D3) 1000 UNIT/SPRAY LIQD Take 1,000 Units by mouth daily.  Marland Kitchen denosumab (PROLIA) 60 MG/ML SOLN injection Inject 60 mg into the skin  every 6 (six) months. Administer in upper arm, thigh, or abdomen  . Multiple Vitamin (MULTIVITAMIN) tablet Take 1 tablet by mouth daily.     Allergies:   Augmentin [amoxicillin-pot clavulanate] and Sulfa antibiotics   Social History   Tobacco Use  . Smoking status: Never Smoker  . Smokeless tobacco: Never Used  Substance Use Topics  . Alcohol use: Yes    Comment: socailly  . Drug use: No     Family Hx: The patient's family history includes CAD in her father.  ROS:   Please see the history of present illness.     All other systems reviewed and  are negative.   Labs/Other Tests and Data Reviewed:    Recent Labs: No results found for requested labs within last 8760 hours.   Recent Lipid Panel No results found for: CHOL, TRIG, HDL, CHOLHDL, LDLCALC, LDLDIRECT  Wt Readings from Last 3 Encounters:  10/23/19 198 lb (89.8 kg)  10/23/18 189 lb (85.7 kg)  09/06/17 193 lb 3.2 oz (87.6 kg)     Objective:    Vital Signs:  BP (!) 145/70   Pulse (!) 59   Temp 97.8 F (36.6 C)   Ht 5\' 5"  (1.651 m)   Wt 198 lb (89.8 kg)   SpO2 97%   BMI 32.95 kg/m     ASSESSMENT & PLAN:    1.  OSA -  The patient is tolerating PAP therapy well without any problems. The PAP download was reviewed today and showed an AHI of 0.7/hr on auto PAP  with 100% compliance in using more than 4 hours nightly.  The patient has been using and benefiting from PAP use and will continue to benefit from therapy.  -I will order a chin strap to see if that helps with her mouth dryness  2.  HTN -BP controlled on exam -she has not required any antihypertensive meds  3.  Obesity -she has gained some weight during COVID 19 -she is back in the gym and has a trainer  COVID-19 Education: The signs and symptoms of COVID-19 were discussed with the patient and how to seek care for testing (follow up with PCP or arrange E-visit).  The importance of social distancing was discussed today.  Patient Risk:   After full review of this patient's clinical status, I feel that they are at least moderate risk at this time.  Time:   Today, I have spent 20 minutes on telemedicine discussing medical problems including OSA, HTN, Obesity and reviewing patient's chart including PAP compliance download.  Medication Adjustments/Labs and Tests Ordered: Current medicines are reviewed at length with t2he patient today.  Concerns regarding medicines are outlined above.  Tests Ordered: No orders of the defined types were placed in this encounter.  Medication Changes: No orders of the  defined types were placed in this encounter.   Disposition:  Follow up in 1 year(s)  Signed, , MD  10/23/2019 9:25 AM    [facility]

## 2020-09-02 DIAGNOSIS — H02831 Dermatochalasis of right upper eyelid: Secondary | ICD-10-CM | POA: Diagnosis not present

## 2020-09-02 DIAGNOSIS — H02834 Dermatochalasis of left upper eyelid: Secondary | ICD-10-CM | POA: Diagnosis not present

## 2020-09-28 DIAGNOSIS — I10 Essential (primary) hypertension: Secondary | ICD-10-CM | POA: Diagnosis not present

## 2020-09-28 DIAGNOSIS — G4733 Obstructive sleep apnea (adult) (pediatric): Secondary | ICD-10-CM | POA: Diagnosis not present

## 2020-10-14 DIAGNOSIS — H53483 Generalized contraction of visual field, bilateral: Secondary | ICD-10-CM | POA: Diagnosis not present

## 2020-10-14 DIAGNOSIS — H02831 Dermatochalasis of right upper eyelid: Secondary | ICD-10-CM | POA: Diagnosis not present

## 2020-10-14 DIAGNOSIS — H57813 Brow ptosis, bilateral: Secondary | ICD-10-CM | POA: Diagnosis not present

## 2020-10-14 DIAGNOSIS — H02832 Dermatochalasis of right lower eyelid: Secondary | ICD-10-CM | POA: Diagnosis not present

## 2020-10-14 DIAGNOSIS — H019 Unspecified inflammation of eyelid: Secondary | ICD-10-CM | POA: Diagnosis not present

## 2020-10-14 DIAGNOSIS — H0279 Other degenerative disorders of eyelid and periocular area: Secondary | ICD-10-CM | POA: Diagnosis not present

## 2020-10-14 DIAGNOSIS — H02423 Myogenic ptosis of bilateral eyelids: Secondary | ICD-10-CM | POA: Diagnosis not present

## 2020-10-14 DIAGNOSIS — H02835 Dermatochalasis of left lower eyelid: Secondary | ICD-10-CM | POA: Diagnosis not present

## 2020-10-14 DIAGNOSIS — H02834 Dermatochalasis of left upper eyelid: Secondary | ICD-10-CM | POA: Diagnosis not present

## 2020-10-14 DIAGNOSIS — H02413 Mechanical ptosis of bilateral eyelids: Secondary | ICD-10-CM | POA: Diagnosis not present

## 2020-10-28 ENCOUNTER — Ambulatory Visit: Payer: Medicare Other | Admitting: Cardiology

## 2020-10-28 ENCOUNTER — Telehealth: Payer: Self-pay | Admitting: *Deleted

## 2020-10-28 ENCOUNTER — Other Ambulatory Visit: Payer: Self-pay

## 2020-10-28 ENCOUNTER — Encounter: Payer: Self-pay | Admitting: Cardiology

## 2020-10-28 VITALS — BP 145/80 | HR 63 | Ht 65.0 in | Wt 201.0 lb

## 2020-10-28 DIAGNOSIS — I10 Essential (primary) hypertension: Secondary | ICD-10-CM | POA: Diagnosis not present

## 2020-10-28 DIAGNOSIS — G4733 Obstructive sleep apnea (adult) (pediatric): Secondary | ICD-10-CM

## 2020-10-28 NOTE — Telephone Encounter (Signed)
Order placed to [employer] [facility] via community message to Please order new supplies and chin strap.

## 2020-10-28 NOTE — Telephone Encounter (Signed)
-----   Message from Theresia Majors, RN sent at 10/28/2020  9:24 AM EDT ----- Please order new supplies and chin strap.  Thanks!

## 2020-10-28 NOTE — Progress Notes (Signed)
Date:  10/28/2020   ID:  Heather Santos, DOB 01/30/1946, MRN [id number]  PCP:  Lupita Raider, MD  Sleep Medicine:  Armanda Magic, MD Electrophysiologist:  None   Chief Complaint:  OSA  History of Present Illness:    Heather Santos is a 75 y.o. female with a hx of severe OSA on CPAP, HTN and obesity.  He is doing well with his CPAP device and thinks that he has gotten used to it.  She tolerates the nasal mask and feels the pressure is adequate.  She feels her current mask does not fit as well.  Since going on CPAP she feels rested in the am and has no significant daytime sleepiness if she sleeps well the night before.  She has some mouth dryness and eye dryness.  She uses a chin strap.  She does not think that he snores.    Prior CV studies:   The following studies were reviewed today:  PAP compliance download  Past Medical History:  Diagnosis Date  . Fatty liver 10/13   on U/S w elevated LFTs  . History of shingles   . Hypertension   . Major depression in remission (HCC)   . Obesity   . OSA (obstructive sleep apnea)    w CPAP AHI 35/hr now on CPAP at 16 cm H2O  . Osteoporosis    started prolia 2012   Past Surgical History:  Procedure Laterality Date  . COLONOSCOPY WITH PROPOFOL N/A 12/04/2016   Procedure: COLONOSCOPY WITH PROPOFOL;  Surgeon: Charna Elizabeth, MD;  Location: [facility];  Service: Endoscopy;  Laterality: N/A;  . HERNIA REPAIR       Current Meds  Medication Sig  . ADVANCED EVENING PRIMROSE OIL PO Take by mouth.  Marland Kitchen BIOTIN PO Take 1 tablet by mouth daily.  . Cholecalciferol (VITAMIN D3) 1000 UNIT/SPRAY LIQD Take 1,000 Units by mouth daily.  Marland Kitchen denosumab (PROLIA) 60 MG/ML SOLN injection Inject 60 mg into the skin every 6 (six) months. Administer in upper arm, thigh, or abdomen  . Multiple Vitamin (MULTIVITAMIN) tablet Take 1 tablet by mouth daily.     Allergies:   Augmentin [amoxicillin-pot clavulanate] and Sulfa antibiotics   Social History   Tobacco Use  .  Smoking status: Never Smoker  . Smokeless tobacco: Never Used  Substance Use Topics  . Alcohol use: Yes    Comment: socailly  . Drug use: No     Family Hx: The patient's family history includes CAD in her father.  ROS:   Please see the history of present illness.     All other systems reviewed and are negative.   Labs/Other Tests and Data Reviewed:    Recent Labs: No results found for requested labs within last 8760 hours.   Recent Lipid Panel No results found for: CHOL, TRIG, HDL, CHOLHDL, LDLCALC, LDLDIRECT  Wt Readings from Last 3 Encounters:  10/28/20 201 lb (91.2 kg)  10/23/19 198 lb (89.8 kg)  10/23/18 189 lb (85.7 kg)     Objective:    Vital Signs:  BP (!) 145/80   Pulse 63   Ht 5\' 5"  (1.651 m)   Wt 201 lb (91.2 kg)   SpO2 97%   BMI 33.45 kg/m    GEN: Well nourished, well developed in no acute distress HEENT: Normal NECK: No JVD; No carotid bruits LYMPHATICS: No lymphadenopathy CARDIAC:RRR, no murmurs, rubs, gallops RESPIRATORY:  Clear to auscultation without rales, wheezing or rhonchi  ABDOMEN: Soft, non-tender, non-distended MUSCULOSKELETAL:  No edema; No deformity  SKIN: Warm and dry NEUROLOGIC:  Alert and oriented x 3 PSYCHIATRIC:  Normal affect    ASSESSMENT & PLAN:    1.  OSA -  The patient is tolerating PAP therapy well without any problems. The PAP download was reviewed today and showed an AHI of 1/hr on auto CPAP with 100% compliance in using more than 4 hours nightly.  The patient has been using and benefiting from PAP use and will continue to benefit from therapy.  -I encouraged her to get a new chin strap which I will place an order for  2.  HTN -BP is borderline controlled on exam today -she has not required any antihypertensive meds  3.  Obesity -I have encouraged her to get back into a routine exercise program and cut back on carbs and portions.   Medication Adjustments/Labs and Tests Ordered: Current medicines are reviewed at  length with t2he patient today.  Concerns regarding medicines are outlined above.  Tests Ordered: No orders of the defined types were placed in this encounter.  Medication Changes: No orders of the defined types were placed in this encounter.   Disposition:  Follow up in 1 year(s)  Signed, Armanda Magic, MD  10/28/2020 9:16 AM    [facility]

## 2020-10-28 NOTE — Patient Instructions (Signed)

## 2020-11-09 DIAGNOSIS — K7581 Nonalcoholic steatohepatitis (NASH): Secondary | ICD-10-CM | POA: Diagnosis not present

## 2020-11-09 DIAGNOSIS — M810 Age-related osteoporosis without current pathological fracture: Secondary | ICD-10-CM | POA: Diagnosis not present

## 2020-11-09 DIAGNOSIS — R7301 Impaired fasting glucose: Secondary | ICD-10-CM | POA: Diagnosis not present

## 2020-11-09 DIAGNOSIS — I10 Essential (primary) hypertension: Secondary | ICD-10-CM | POA: Diagnosis not present

## 2020-12-27 DIAGNOSIS — I10 Essential (primary) hypertension: Secondary | ICD-10-CM | POA: Diagnosis not present

## 2020-12-27 DIAGNOSIS — G4733 Obstructive sleep apnea (adult) (pediatric): Secondary | ICD-10-CM | POA: Diagnosis not present

## 2021-04-26 DIAGNOSIS — G4733 Obstructive sleep apnea (adult) (pediatric): Secondary | ICD-10-CM | POA: Diagnosis not present

## 2021-04-26 DIAGNOSIS — I10 Essential (primary) hypertension: Secondary | ICD-10-CM | POA: Diagnosis not present

## 2021-05-16 DIAGNOSIS — G4733 Obstructive sleep apnea (adult) (pediatric): Secondary | ICD-10-CM | POA: Diagnosis not present

## 2021-05-16 DIAGNOSIS — I10 Essential (primary) hypertension: Secondary | ICD-10-CM | POA: Diagnosis not present

## 2021-05-16 DIAGNOSIS — M810 Age-related osteoporosis without current pathological fracture: Secondary | ICD-10-CM | POA: Diagnosis not present

## 2021-05-16 DIAGNOSIS — Z23 Encounter for immunization: Secondary | ICD-10-CM | POA: Diagnosis not present

## 2021-05-16 DIAGNOSIS — Z0000 Encounter for general adult medical examination without abnormal findings: Secondary | ICD-10-CM | POA: Diagnosis not present

## 2021-05-16 DIAGNOSIS — R7301 Impaired fasting glucose: Secondary | ICD-10-CM | POA: Diagnosis not present

## 2021-05-16 DIAGNOSIS — K7581 Nonalcoholic steatohepatitis (NASH): Secondary | ICD-10-CM | POA: Diagnosis not present

## 2021-06-12 DIAGNOSIS — M85852 Other specified disorders of bone density and structure, left thigh: Secondary | ICD-10-CM | POA: Diagnosis not present

## 2021-06-12 DIAGNOSIS — M810 Age-related osteoporosis without current pathological fracture: Secondary | ICD-10-CM | POA: Diagnosis not present

## 2021-06-12 DIAGNOSIS — Z780 Asymptomatic menopausal state: Secondary | ICD-10-CM | POA: Diagnosis not present

## 2021-06-27 DIAGNOSIS — Z1231 Encounter for screening mammogram for malignant neoplasm of breast: Secondary | ICD-10-CM | POA: Diagnosis not present

## 2021-08-12 DIAGNOSIS — I10 Essential (primary) hypertension: Secondary | ICD-10-CM | POA: Diagnosis not present

## 2021-08-12 DIAGNOSIS — G4733 Obstructive sleep apnea (adult) (pediatric): Secondary | ICD-10-CM | POA: Diagnosis not present

## 2021-09-06 DIAGNOSIS — Z961 Presence of intraocular lens: Secondary | ICD-10-CM | POA: Diagnosis not present

## 2021-09-06 DIAGNOSIS — H5202 Hypermetropia, left eye: Secondary | ICD-10-CM | POA: Diagnosis not present

## 2021-09-06 DIAGNOSIS — H524 Presbyopia: Secondary | ICD-10-CM | POA: Diagnosis not present

## 2021-11-10 DIAGNOSIS — I10 Essential (primary) hypertension: Secondary | ICD-10-CM | POA: Diagnosis not present

## 2021-11-10 DIAGNOSIS — G4733 Obstructive sleep apnea (adult) (pediatric): Secondary | ICD-10-CM | POA: Diagnosis not present

## 2021-11-28 DIAGNOSIS — M810 Age-related osteoporosis without current pathological fracture: Secondary | ICD-10-CM | POA: Diagnosis not present

## 2021-11-28 DIAGNOSIS — I10 Essential (primary) hypertension: Secondary | ICD-10-CM | POA: Diagnosis not present

## 2021-11-28 DIAGNOSIS — R7301 Impaired fasting glucose: Secondary | ICD-10-CM | POA: Diagnosis not present

## 2022-02-12 DIAGNOSIS — I10 Essential (primary) hypertension: Secondary | ICD-10-CM | POA: Diagnosis not present

## 2022-02-12 DIAGNOSIS — G4733 Obstructive sleep apnea (adult) (pediatric): Secondary | ICD-10-CM | POA: Diagnosis not present

## 2022-03-02 ENCOUNTER — Ambulatory Visit: Payer: Self-pay

## 2022-03-02 DIAGNOSIS — E1169 Type 2 diabetes mellitus with other specified complication: Secondary | ICD-10-CM | POA: Diagnosis not present

## 2022-03-02 DIAGNOSIS — K7581 Nonalcoholic steatohepatitis (NASH): Secondary | ICD-10-CM | POA: Diagnosis not present

## 2022-03-02 NOTE — Patient Outreach (Signed)
  Care Coordination   03/02/2022 Name: Heather Santos MRN: [id number] DOB: 31-Oct-1945   Care Coordination Outreach Attempts:  An unsuccessful telephone outreach was attempted today to offer the patient information about available care coordination services as a benefit of their health plan.   Follow Up Plan:  Additional outreach attempts will be made to offer the patient care coordination information and services.   Encounter Outcome:  No Answer  Care Coordination Interventions Activated:  No   Care Coordination Interventions:  No, not indicated    [facility] Care Management [phone]

## 2022-03-02 NOTE — Patient Outreach (Signed)
  Care Coordination   Initial Visit Note   03/02/2022 Name: Heather Santos MRN: [id number] DOB: 09/18/1945  Heather Santos is a 76 y.o. year old female who sees Heather Raider, MD for primary care. I spoke with  Heather Santos by phone today.  What matters to the patients health and wellness today?  No concerns expressed today. Patient returned call. Requested to schedule outreach for next week.    Goals Addressed   None     SDOH assessments and interventions completed:  No     Care Coordination Interventions Activated:  No  Care Coordination Interventions:  No, not indicated   Follow up plan:  Will follow up next week.    Encounter Outcome:  Pt. Request to Call Back   [facility] Care Management [phone]

## 2022-05-31 NOTE — Progress Notes (Signed)
Date:  06/01/2022   ID:  Heather Santos, DOB [birth year], MRN [id number]  PCP:  Lupita Raider, MD  Sleep Medicine:  Armanda Magic, MD Electrophysiologist:  None   Chief Complaint:  OSA  History of Present Illness:    Heather Santos is a 76 y.o. female with a hx of  severe OSA on CPAP, HTN and obesity.  Sh is doing well with his PAP device and thinks that she has gotten used to it.  She got a new mask that is a nasal mask that she does not like and used to have a nasal pillow mask.  She feels the pressure is adequate.  Since going on PAP she feels rested in the am and has no significant daytime sleepiness but that changed when she got the new mask and that one slides a lot and wakes her up.  She denies any significant mouth or nasal dryness or nasal congestion.  She does not think that he snores.    Prior CV studies:   The following studies were reviewed today:  PAP compliance download  Past Medical History:  Diagnosis Date   Fatty liver 10/13   on U/S w elevated LFTs   History of shingles    Hypertension    Major depression in remission (HCC)    Obesity    OSA (obstructive sleep apnea)    w CPAP AHI 35/hr now on CPAP at 16 cm H2O   Osteoporosis    started prolia 2012   Past Surgical History:  Procedure Laterality Date   COLONOSCOPY WITH PROPOFOL N/A 12/04/2016   Procedure: COLONOSCOPY WITH PROPOFOL;  Surgeon: Charna Elizabeth, MD;  Location: [facility];  Service: Endoscopy;  Laterality: N/A;   HERNIA REPAIR       Current Meds  Medication Sig   aspirin EC 81 MG tablet Take 81 mg by mouth daily. Swallow whole.     Allergies:   Augmentin [amoxicillin-pot clavulanate]   Social History   Tobacco Use   Smoking status: Never   Smokeless tobacco: Never  Substance Use Topics   Alcohol use: Yes    Comment: socailly   Drug use: No     Family Hx: The patient's family history includes CAD in her father.  ROS:   Please see the history of present illness.     All other  systems reviewed and are negative.   Labs/Other Tests and Data Reviewed:    Recent Labs: No results found for requested labs within last 365 days.   Recent Lipid Panel No results found for: "CHOL", "TRIG", "HDL", "CHOLHDL", "LDLCALC", "LDLDIRECT"  Wt Readings from Last 3 Encounters:  06/01/22 182 lb (82.6 kg)  10/28/20 201 lb (91.2 kg)  10/23/19 198 lb (89.8 kg)     Objective:    Vital Signs:  BP 136/68   Pulse 80   Ht 5\' 4"  (1.626 m)   Wt 182 lb (82.6 kg)   SpO2 97%   BMI 31.24 kg/m    GEN: Well nourished, well developed in no acute distress HEENT: Normal NECK: No JVD; No carotid bruits LYMPHATICS: No lymphadenopathy CARDIAC:RRR, no murmurs, rubs, gallops RESPIRATORY:  Clear to auscultation without rales, wheezing or rhonchi  ABDOMEN: Soft, non-tender, non-distended MUSCULOSKELETAL:  No edema; No deformity  SKIN: Warm and dry NEUROLOGIC:  Alert and oriented x 3 PSYCHIATRIC:  Normal affect   ASSESSMENT & PLAN:    1.  OSA - The patient is tolerating PAP therapy well without any problems. The  PAP download performed by his DME was personally reviewed and interpreted by me today and showed an AHI of 1/hr on auto CPAP from 4 to 20cm H2O cm H2O with 100% compliance in using more than 4 hours nightly.  The patient has been using and benefiting from PAP use and will continue to benefit from therapy.  -I will order her a new nasal pillow mask since she does not like the current mask she has  2.  HTN -BP is controlled on exam today -she has not required any antihypertensive meds  Medication Adjustments/Labs and Tests Ordered: Current medicines are reviewed at length with t2he patient today.  Concerns regarding medicines are outlined above.  Tests Ordered: No orders of the defined types were placed in this encounter.  Medication Changes: No orders of the defined types were placed in this encounter.   Disposition:  Follow up in 1 year(s)  Signed, Armanda Magic, MD   06/01/2022 8:24 AM    [facility]

## 2022-06-01 ENCOUNTER — Telehealth: Payer: Self-pay | Admitting: *Deleted

## 2022-06-01 ENCOUNTER — Ambulatory Visit: Payer: Medicare Other | Attending: Cardiology | Admitting: Cardiology

## 2022-06-01 ENCOUNTER — Encounter: Payer: Self-pay | Admitting: Cardiology

## 2022-06-01 VITALS — BP 136/68 | HR 80 | Ht 64.0 in | Wt 182.0 lb

## 2022-06-01 DIAGNOSIS — G4733 Obstructive sleep apnea (adult) (pediatric): Secondary | ICD-10-CM

## 2022-06-01 DIAGNOSIS — I10 Essential (primary) hypertension: Secondary | ICD-10-CM | POA: Diagnosis not present

## 2022-06-01 NOTE — Telephone Encounter (Signed)
-----   Message from Lattie Haw, RN sent at 06/01/2022  8:34 AM EST ----- Regarding: Orders Per Dr. Mayford Knife:  Patient needs Nasal Pillow Mask

## 2022-06-01 NOTE — Patient Instructions (Signed)
Medication Instructions:  Your physician recommends that you continue on your current medications as directed. Please refer to the Current Medication list given to you today.  *If you need a refill on your cardiac medications before your next appointment, please call your pharmacy*   Lab Work: None If you have labs (blood work) drawn today and your tests are completely normal, you will receive your results only by: MyChart Message (if you have MyChart) OR A paper copy in the mail If you have any lab test that is abnormal or we need to change your treatment, we will call you to review the results.   Testing/Procedures: None   Follow-Up: At [facility], you and your health needs are our priority.  As part of our continuing mission to provide you with exceptional heart care, we have created designated Provider Care Teams.  These Care Teams include your primary Cardiologist (physician) and Advanced Practice Providers (APPs -  Physician Assistants and Nurse Practitioners) who all work together to provide you with the care you need, when you need it.  We recommend signing up for the patient portal called "MyChart".  Sign up information is provided on this After Visit Summary.  MyChart is used to connect with patients for Virtual Visits (Telemedicine).  Patients are able to view lab/test results, encounter notes, upcoming appointments, etc.  Non-urgent messages can be sent to your provider as well.   To learn more about what you can do with MyChart, go to [email or web address].    Your next appointment:   12 month(s)  The format for your next appointment:   In Person  Provider:   Armanda Magic, MD Other Instructions Our Sleep Coordinator will order a nasal pillow mask for you  Important Information About Sugar

## 2022-06-01 NOTE — Telephone Encounter (Signed)
Order placed to [employer] via community message for Nasal Pillow Mask

## 2022-06-05 DIAGNOSIS — I10 Essential (primary) hypertension: Secondary | ICD-10-CM | POA: Diagnosis not present

## 2022-06-05 DIAGNOSIS — G4733 Obstructive sleep apnea (adult) (pediatric): Secondary | ICD-10-CM | POA: Diagnosis not present

## 2022-06-06 DIAGNOSIS — M810 Age-related osteoporosis without current pathological fracture: Secondary | ICD-10-CM | POA: Diagnosis not present

## 2022-06-06 DIAGNOSIS — I10 Essential (primary) hypertension: Secondary | ICD-10-CM | POA: Diagnosis not present

## 2022-06-06 DIAGNOSIS — Z23 Encounter for immunization: Secondary | ICD-10-CM | POA: Diagnosis not present

## 2022-06-06 DIAGNOSIS — Z0000 Encounter for general adult medical examination without abnormal findings: Secondary | ICD-10-CM | POA: Diagnosis not present

## 2022-06-06 DIAGNOSIS — K7581 Nonalcoholic steatohepatitis (NASH): Secondary | ICD-10-CM | POA: Diagnosis not present

## 2022-06-06 DIAGNOSIS — E1169 Type 2 diabetes mellitus with other specified complication: Secondary | ICD-10-CM | POA: Diagnosis not present

## 2022-06-06 DIAGNOSIS — G4733 Obstructive sleep apnea (adult) (pediatric): Secondary | ICD-10-CM | POA: Diagnosis not present

## 2022-06-27 DIAGNOSIS — G4733 Obstructive sleep apnea (adult) (pediatric): Secondary | ICD-10-CM | POA: Diagnosis not present

## 2022-06-27 DIAGNOSIS — U071 COVID-19: Secondary | ICD-10-CM | POA: Diagnosis not present

## 2022-07-20 DIAGNOSIS — Z1231 Encounter for screening mammogram for malignant neoplasm of breast: Secondary | ICD-10-CM | POA: Diagnosis not present

## 2022-09-13 ENCOUNTER — Other Ambulatory Visit: Payer: Self-pay | Admitting: Family Medicine

## 2022-09-13 DIAGNOSIS — R1909 Other intra-abdominal and pelvic swelling, mass and lump: Secondary | ICD-10-CM

## 2022-09-20 ENCOUNTER — Ambulatory Visit
Admission: RE | Admit: 2022-09-20 | Discharge: 2022-09-20 | Disposition: A | Discharge status: Home / Self Care | Payer: Medicare Other | Source: Ambulatory Visit | Attending: Family Medicine | Admitting: Family Medicine

## 2022-09-20 DIAGNOSIS — G4733 Obstructive sleep apnea (adult) (pediatric): Secondary | ICD-10-CM | POA: Diagnosis not present

## 2022-09-20 DIAGNOSIS — I10 Essential (primary) hypertension: Secondary | ICD-10-CM | POA: Diagnosis not present

## 2022-09-20 DIAGNOSIS — K429 Umbilical hernia without obstruction or gangrene: Secondary | ICD-10-CM | POA: Diagnosis not present

## 2022-09-20 DIAGNOSIS — R1909 Other intra-abdominal and pelvic swelling, mass and lump: Secondary | ICD-10-CM

## 2022-09-25 DIAGNOSIS — Z961 Presence of intraocular lens: Secondary | ICD-10-CM | POA: Diagnosis not present

## 2022-09-25 DIAGNOSIS — E119 Type 2 diabetes mellitus without complications: Secondary | ICD-10-CM | POA: Diagnosis not present

## 2022-10-01 DIAGNOSIS — L309 Dermatitis, unspecified: Secondary | ICD-10-CM | POA: Diagnosis not present

## 2022-10-15 ENCOUNTER — Ambulatory Visit: Payer: Self-pay | Admitting: Surgery

## 2022-10-15 DIAGNOSIS — K429 Umbilical hernia without obstruction or gangrene: Secondary | ICD-10-CM | POA: Diagnosis not present

## 2022-11-12 ENCOUNTER — Encounter (HOSPITAL_BASED_OUTPATIENT_CLINIC_OR_DEPARTMENT_OTHER): Payer: Self-pay | Admitting: Surgery

## 2022-11-12 ENCOUNTER — Other Ambulatory Visit: Payer: Self-pay

## 2022-11-12 NOTE — Progress Notes (Signed)
   11/12/22 1321  Pre-op Phone Call  Surgery Date Verified 11/20/22  Arrival Time Verified 0600  Surgery Location Verified [facility] [place]  Medical History Reviewed Yes  Is the patient taking a GLP-1 receptor agonist? No  Does the patient have diabetes? Pre-diabetes  Do you have a history of heart problems? No  Antiarrhythmic device type  (NA)  Does patient have other implanted devices? No  Patient Teaching Enhanced Recovery;Pre / Post Procedure;Pre-op CHG Bathing  Patient educated about smoking cessation 24 hours prior to surgery. N/A Non-Smoker  Patient verbalizes understanding of bowel prep? N/A  Med Rec Completed Yes  Take the Following Meds the Morning of Surgery no meds AM of sx; hold ASA, nsaids, vitamin/herbals x5d prior to sx  Recent  Lab Work, EKG, CXR? No  NPO (Including gum & candy) After midnight  Patient instructed to stop clear liquids including Carb loading drink at: 0400 (G2)  Stop Solids, Milk, Candy, and Gum STARTING AT MIDNIGHT  Responsible adult to drive and be with you for 24 hours? Yes  Name & Phone Number for Ride/Caregiver husband, Buster  No Jewelry, money, nail polish or make-up.  No lotions, powders, perfumes. No shaving  48 hrs. prior to surgery. Yes  Contacts, Dentures & Glasses Will Have to be Removed Before OR. Yes  Please bring your ID and Insurance Card the morning of your surgery. (Surgery Centers Only) Yes  Bring any papers or x-rays with you that your surgeon gave you. Yes  Instructed to contact the location of procedure/ provider if they or anyone in their household develops symptoms or tests positive for COVID-19, has close contact with someone who tests positive for COVID, or has known exposure to any contagious illness. Yes  Call this number the morning of surgery  with any problems that may cancel your surgery. [phone]  Covid-19 Assessment  Have you had a positive COVID-19 test within the previous 90 days? No  COVID Testing Guidance Proceed with  the additional questions.  Patient's surgery required a COVID-19 test (cardiothoracic, complex ENT, and bronchoscopies/ EBUS) No  Have you been unmasked and in close contact with anyone with COVID-19 or COVID-19 symptoms within the past 10 days? No  Do you or anyone in your household currently have any COVID-19 symptoms? No

## 2022-11-16 MED ORDER — CHLORHEXIDINE GLUCONATE CLOTH 2 % EX PADS: 6.0000 | MEDICATED_PAD | Freq: Once | CUTANEOUS | Status: DC

## 2022-11-16 NOTE — Progress Notes (Signed)

## 2022-11-20 ENCOUNTER — Other Ambulatory Visit: Payer: Self-pay

## 2022-11-20 ENCOUNTER — Encounter (HOSPITAL_BASED_OUTPATIENT_CLINIC_OR_DEPARTMENT_OTHER): Payer: Self-pay | Admitting: Surgery

## 2022-11-20 ENCOUNTER — Ambulatory Visit (HOSPITAL_BASED_OUTPATIENT_CLINIC_OR_DEPARTMENT_OTHER): Payer: Medicare Other | Admitting: Anesthesiology

## 2022-11-20 ENCOUNTER — Ambulatory Visit (HOSPITAL_BASED_OUTPATIENT_CLINIC_OR_DEPARTMENT_OTHER)
Admission: RE | Admit: 2022-11-20 | Discharge: 2022-11-20 | Disposition: A | Discharge status: Home / Self Care | Payer: Medicare Other | Attending: Surgery | Admitting: Surgery

## 2022-11-20 ENCOUNTER — Encounter (HOSPITAL_BASED_OUTPATIENT_CLINIC_OR_DEPARTMENT_OTHER)
Admission: RE | Disposition: A | Discharge status: Home / Self Care | Payer: Self-pay | Source: Home / Self Care | Attending: Surgery

## 2022-11-20 DIAGNOSIS — F32A Depression, unspecified: Secondary | ICD-10-CM | POA: Insufficient documentation

## 2022-11-20 DIAGNOSIS — G4733 Obstructive sleep apnea (adult) (pediatric): Secondary | ICD-10-CM

## 2022-11-20 DIAGNOSIS — E119 Type 2 diabetes mellitus without complications: Secondary | ICD-10-CM | POA: Diagnosis not present

## 2022-11-20 DIAGNOSIS — I10 Essential (primary) hypertension: Secondary | ICD-10-CM

## 2022-11-20 DIAGNOSIS — Z9989 Dependence on other enabling machines and devices: Secondary | ICD-10-CM

## 2022-11-20 DIAGNOSIS — K429 Umbilical hernia without obstruction or gangrene: Secondary | ICD-10-CM | POA: Diagnosis not present

## 2022-11-20 DIAGNOSIS — Z6831 Body mass index (BMI) 31.0-31.9, adult: Secondary | ICD-10-CM | POA: Insufficient documentation

## 2022-11-20 DIAGNOSIS — E669 Obesity, unspecified: Secondary | ICD-10-CM | POA: Diagnosis not present

## 2022-11-20 DIAGNOSIS — G4730 Sleep apnea, unspecified: Secondary | ICD-10-CM | POA: Diagnosis not present

## 2022-11-20 HISTORY — PX: UMBILICAL HERNIA REPAIR: SHX196

## 2022-11-20 HISTORY — DX: Other specified postprocedural states: Z98.890

## 2022-11-20 SURGERY — REPAIR, HERNIA, UMBILICAL, ADULT
Anesthesia: General | Site: Abdomen

## 2022-11-20 MED ORDER — SUCCINYLCHOLINE CHLORIDE 200 MG/10ML IV SOSY
PREFILLED_SYRINGE | INTRAVENOUS | Status: AC
  Filled 2022-11-20: qty 10

## 2022-11-20 MED ORDER — LACTATED RINGERS IV SOLN: INTRAVENOUS | Status: DC

## 2022-11-20 MED ORDER — HYDROMORPHONE HCL 1 MG/ML IJ SOLN: 0.2500 mg | INTRAMUSCULAR | Status: DC | PRN

## 2022-11-20 MED ORDER — EPHEDRINE SULFATE (PRESSORS) 50 MG/ML IJ SOLN
INTRAMUSCULAR | Status: DC | PRN
  Administered 2022-11-20: 5 mg via INTRAVENOUS
  Administered 2022-11-20: 10 mg via INTRAVENOUS
  Administered 2022-11-20 (×2): 5 mg via INTRAVENOUS

## 2022-11-20 MED ORDER — ROCURONIUM BROMIDE 100 MG/10ML IV SOLN
INTRAVENOUS | Status: DC | PRN
  Administered 2022-11-20: 50 mg via INTRAVENOUS

## 2022-11-20 MED ORDER — OXYCODONE HCL 5 MG PO TABS: 5.0000 mg | ORAL_TABLET | Freq: Once | ORAL | Status: DC | PRN

## 2022-11-20 MED ORDER — DEXAMETHASONE SODIUM PHOSPHATE 10 MG/ML IJ SOLN
INTRAMUSCULAR | Status: AC
  Filled 2022-11-20: qty 1

## 2022-11-20 MED ORDER — ONDANSETRON HCL 4 MG/2ML IJ SOLN
INTRAMUSCULAR | Status: AC
  Filled 2022-11-20: qty 2

## 2022-11-20 MED ORDER — OXYCODONE HCL 5 MG PO TABS
5.0000 mg | ORAL_TABLET | Freq: Four times a day (QID) | ORAL | 0 refills | Status: DC | PRN
Start: 2022-11-20 — End: 2023-11-04

## 2022-11-20 MED ORDER — ROCURONIUM BROMIDE 10 MG/ML (PF) SYRINGE
PREFILLED_SYRINGE | INTRAVENOUS | Status: AC
  Filled 2022-11-20: qty 10

## 2022-11-20 MED ORDER — IBUPROFEN 200 MG PO TABS
ORAL_TABLET | ORAL | Status: AC
  Filled 2022-11-20: qty 2

## 2022-11-20 MED ORDER — EPHEDRINE 5 MG/ML INJ
INTRAVENOUS | Status: AC
  Filled 2022-11-20: qty 5

## 2022-11-20 MED ORDER — PROMETHAZINE HCL 25 MG/ML IJ SOLN: 6.2500 mg | INTRAMUSCULAR | Status: DC | PRN

## 2022-11-20 MED ORDER — CLINDAMYCIN PHOSPHATE 900 MG/50ML IV SOLN
INTRAVENOUS | Status: AC
  Filled 2022-11-20: qty 50

## 2022-11-20 MED ORDER — PROPOFOL 10 MG/ML IV BOLUS
INTRAVENOUS | Status: AC
  Filled 2022-11-20: qty 20

## 2022-11-20 MED ORDER — CLINDAMYCIN PHOSPHATE 900 MG/50ML IV SOLN
900.0000 mg | INTRAVENOUS | Status: AC
  Administered 2022-11-20: 900 mg via INTRAVENOUS

## 2022-11-20 MED ORDER — LIDOCAINE 2% (20 MG/ML) 5 ML SYRINGE
INTRAMUSCULAR | Status: DC | PRN
  Administered 2022-11-20: 60 mg via INTRAVENOUS

## 2022-11-20 MED ORDER — ONDANSETRON HCL 4 MG/2ML IJ SOLN
INTRAMUSCULAR | Status: DC | PRN
  Administered 2022-11-20: 4 mg via INTRAVENOUS

## 2022-11-20 MED ORDER — FENTANYL CITRATE (PF) 100 MCG/2ML IJ SOLN
INTRAMUSCULAR | Status: DC | PRN
  Administered 2022-11-20: 50 ug via INTRAVENOUS

## 2022-11-20 MED ORDER — FENTANYL CITRATE (PF) 100 MCG/2ML IJ SOLN
INTRAMUSCULAR | Status: AC
  Filled 2022-11-20: qty 2

## 2022-11-20 MED ORDER — IBUPROFEN 200 MG PO TABS
400.0000 mg | ORAL_TABLET | Freq: Once | ORAL | Status: AC
  Administered 2022-11-20: 400 mg via ORAL

## 2022-11-20 MED ORDER — DEXAMETHASONE SODIUM PHOSPHATE 10 MG/ML IJ SOLN
INTRAMUSCULAR | Status: DC | PRN
  Administered 2022-11-20: 5 mg via INTRAVENOUS

## 2022-11-20 MED ORDER — DROPERIDOL 2.5 MG/ML IJ SOLN
INTRAMUSCULAR | Status: DC | PRN
  Administered 2022-11-20: .625 mg via INTRAVENOUS

## 2022-11-20 MED ORDER — BUPIVACAINE-EPINEPHRINE (PF) 0.25% -1:200000 IJ SOLN
INTRAMUSCULAR | Status: AC
  Filled 2022-11-20: qty 60

## 2022-11-20 MED ORDER — ACETAMINOPHEN 500 MG PO TABS
1000.0000 mg | ORAL_TABLET | ORAL | Status: AC
  Administered 2022-11-20: 1000 mg via ORAL

## 2022-11-20 MED ORDER — PROPOFOL 10 MG/ML IV BOLUS
INTRAVENOUS | Status: DC | PRN
  Administered 2022-11-20: 150 mg via INTRAVENOUS

## 2022-11-20 MED ORDER — OXYCODONE HCL 5 MG/5ML PO SOLN: 5.0000 mg | Freq: Once | ORAL | Status: DC | PRN

## 2022-11-20 MED ORDER — ACETAMINOPHEN 500 MG PO TABS
ORAL_TABLET | ORAL | Status: AC
  Filled 2022-11-20: qty 2

## 2022-11-20 MED ORDER — LIDOCAINE 2% (20 MG/ML) 5 ML SYRINGE
INTRAMUSCULAR | Status: AC
  Filled 2022-11-20: qty 5

## 2022-11-20 MED ORDER — BUPIVACAINE-EPINEPHRINE 0.25% -1:200000 IJ SOLN
INTRAMUSCULAR | Status: DC | PRN
  Administered 2022-11-20: 10 mL

## 2022-11-20 MED ORDER — DROPERIDOL 2.5 MG/ML IJ SOLN
INTRAMUSCULAR | Status: AC
  Filled 2022-11-20: qty 2

## 2022-11-20 MED ORDER — ATROPINE SULFATE 0.4 MG/ML IV SOLN
INTRAVENOUS | Status: AC
  Filled 2022-11-20: qty 1

## 2022-11-20 MED ORDER — SUGAMMADEX SODIUM 200 MG/2ML IV SOLN
INTRAVENOUS | Status: DC | PRN
  Administered 2022-11-20: 200 mg via INTRAVENOUS

## 2022-11-20 SURGICAL SUPPLY — 49 items
ADH SKN CLS APL DERMABOND .7 (GAUZE/BANDAGES/DRESSINGS) ×1
APL PRP STRL LF DISP 70% ISPRP (MISCELLANEOUS) ×1
APL SKNCLS STERI-STRIP NONHPOA (GAUZE/BANDAGES/DRESSINGS)
BENZOIN TINCTURE PRP APPL 2/3 (GAUZE/BANDAGES/DRESSINGS) IMPLANT
BLADE CLIPPER SURG (BLADE) IMPLANT
BLADE SURG 10 STRL SS (BLADE) IMPLANT
BLADE SURG 15 STRL LF DISP TIS (BLADE) ×1 IMPLANT
BLADE SURG 15 STRL SS (BLADE) ×1
CANISTER SUCT 1200ML W/VALVE (MISCELLANEOUS) IMPLANT
CHLORAPREP W/TINT 26 (MISCELLANEOUS) ×1 IMPLANT
COVER BACK TABLE 60X90IN (DRAPES) ×1 IMPLANT
COVER MAYO STAND STRL (DRAPES) ×1 IMPLANT
DERMABOND ADVANCED .7 DNX12 (GAUZE/BANDAGES/DRESSINGS) ×1 IMPLANT
DRAPE LAPAROTOMY TRNSV 102X78 (DRAPES) ×1 IMPLANT
DRAPE UTILITY XL STRL (DRAPES) ×1 IMPLANT
DRSG TEGADERM 4X4.75 (GAUZE/BANDAGES/DRESSINGS) IMPLANT
ELECT COATED BLADE 2.86 ST (ELECTRODE) ×1 IMPLANT
ELECT REM PT RETURN 9FT ADLT (ELECTROSURGICAL) ×1
ELECTRODE REM PT RTRN 9FT ADLT (ELECTROSURGICAL) ×1 IMPLANT
GLOVE BIOGEL PI IND STRL 8 (GLOVE) ×1 IMPLANT
GLOVE ECLIPSE 8.0 STRL XLNG CF (GLOVE) ×1 IMPLANT
GOWN STRL REUS W/ TWL LRG LVL3 (GOWN DISPOSABLE) ×2 IMPLANT
GOWN STRL REUS W/ TWL XL LVL3 (GOWN DISPOSABLE) ×1 IMPLANT
GOWN STRL REUS W/TWL LRG LVL3 (GOWN DISPOSABLE) ×2
GOWN STRL REUS W/TWL XL LVL3 (GOWN DISPOSABLE) ×1
MESH VENTRALEX ST 1-7/10 CRC S (Mesh General) IMPLANT
NDL HYPO 22X1.5 SAFETY MO (MISCELLANEOUS) IMPLANT
NDL HYPO 25X1 1.5 SAFETY (NEEDLE) ×1 IMPLANT
NEEDLE HYPO 22X1.5 SAFETY MO (MISCELLANEOUS) IMPLANT
NEEDLE HYPO 25X1 1.5 SAFETY (NEEDLE) ×1 IMPLANT
NS IRRIG 1000ML POUR BTL (IV SOLUTION) IMPLANT
PACK BASIN DAY SURGERY FS (CUSTOM PROCEDURE TRAY) ×1 IMPLANT
PENCIL SMOKE EVACUATOR (MISCELLANEOUS) ×1 IMPLANT
SLEEVE SCD COMPRESS KNEE MED (STOCKING) ×1 IMPLANT
SPIKE FLUID TRANSFER (MISCELLANEOUS) IMPLANT
STAPLER VISISTAT 35W (STAPLE) IMPLANT
STRIP CLOSURE SKIN 1/2X4 (GAUZE/BANDAGES/DRESSINGS) IMPLANT
SUT MNCRL AB 4-0 PS2 18 (SUTURE) IMPLANT
SUT MON AB 4-0 PC3 18 (SUTURE) ×1 IMPLANT
SUT NOVA 0 T19/GS 22DT (SUTURE) IMPLANT
SUT NOVA NAB DX-16 0-1 5-0 T12 (SUTURE) IMPLANT
SUT SILK 3 0 SH 30 (SUTURE) IMPLANT
SUT VIC AB 2-0 SH 27 (SUTURE) ×1
SUT VIC AB 2-0 SH 27XBRD (SUTURE) ×1 IMPLANT
SUT VICRYL 3-0 CR8 SH (SUTURE) ×1 IMPLANT
SYR CONTROL 10ML LL (SYRINGE) ×1 IMPLANT
TOWEL GREEN STERILE FF (TOWEL DISPOSABLE) ×1 IMPLANT
TUBE CONNECTING 20X1/4 (TUBING) IMPLANT
YANKAUER SUCT BULB TIP NO VENT (SUCTIONS) IMPLANT

## 2022-11-20 NOTE — H&P (Signed)
Heather Santos is a 77 y.o. female who is seen today as an office consultation for evaluation of Hernia  Patient presents for evaluation of umbilical hernia. She has had it for a year. It is causing tenderness. An ultrasound showed 2 small defects at the umbilicus with fat in them. No nausea or vomiting. It is becoming more noticeable with the patient.   Review of Systems: A complete review of systems was obtained from the patient. I have reviewed this information and discussed as appropriate with the patient. See HPI as well for other ROS.    Medical History: Past Medical History: Diagnosis Date Arthritis Diabetes mellitus without complication (CMS/HHS-HCC) Sleep apnea  There is no problem list on file for this patient.  Past Surgical History: Procedure Laterality Date CATARACT EXTRACTION HERNIA REPAIR tonsilectomy   Allergies Allergen Reactions Betamethasone, Augmented Nausea  Current Outpatient Medications on File Prior to Visit Medication Sig Dispense Refill aspirin 81 MG EC tablet Take 81 mg by mouth once daily BIOTIN ORAL Take by mouth denosumab (PROLIA SUBQ) Inject subcutaneously VITAMIN D3 ORAL Take by mouth  No current facility-administered medications on file prior to visit.  Family History Problem Relation Age of Onset Deep vein thrombosis (DVT or abnormal blood clot formation) Mother High blood pressure (Hypertension) Father Hyperlipidemia (Elevated cholesterol) Father Diabetes Father Myocardial Infarction (Heart attack) Father   Social History  Tobacco Use Smoking Status Never Smokeless Tobacco Never   Social History  Socioeconomic History Marital status: Married Tobacco Use Smoking status: Never Smokeless tobacco: Never  Objective:  Vitals: 10/15/22 0934 BP: 136/78 Pulse: 68 Weight: 81.6 kg (180 lb) Height: 163.8 cm (5' 4.5") PainSc: 0-No pain  Body mass index is 30.42 kg/m.  Physical Exam HENT: Head:  Normocephalic. Cardiovascular: Rate and Rhythm: Normal rate. Pulmonary: Effort: Pulmonary effort is normal. Abdominal: Palpations: Abdomen is soft. Tenderness: There is no abdominal tenderness. Hernia: A hernia is present. Hernia is present in the umbilical area.  Comments: 2 CM UMBILICAL HERNIA 2 DEFECTS NOTED Musculoskeletal: General: Normal range of motion. Cervical back: Normal range of motion. Skin: General: Skin is warm. Neurological: General: No focal deficit present. Mental Status: She is alert. Psychiatric: Mood and Affect: Mood normal.    Labs, Imaging and Diagnostic Testing:  CLINICAL DATA: Palpable lump  EXAM: ULTRASOUND ABDOMEN LIMITED  COMPARISON: None Available.  FINDINGS: The palpable lump to the left of the umbilicus corresponds to a fat containing umbilical hernia. The neck measures 5 mm and the sac measures 14 x 12 mm. Another adjacent periumbilical hernia is identified measuring up to 21 mm with a 15 mm neck.  IMPRESSION: Two periumbilical hernias as above.   Electronically Signed By: Gerome Sam III M.D. On: 09/20/2022 16:19  Assessment and Plan:  Diagnoses and all orders for this visit:  Umbilical hernia without obstruction or gangrene    Discussed repair options and observation periods causing her discomfort therefore she requires repair. Risks and benefits of hernia surgery reviewed as well as the use of mesh. She is opted for open repair of her umbilical hernia with mesh. Risks and benefits of mesh use reviewed as well as chronic pain, infection and potential injury to internal organs. Risk of bleeding, infection, bowel injury, bladder injury, the need for the treatment options, anesthesia risk and expected recovery were all reviewed today with the patient.  Hayden Rasmussen, MD

## 2022-11-20 NOTE — Interval H&P Note (Signed)
History and Physical Interval Note:  11/20/2022 7:18 AM  Heather Santos  has presented today for surgery, with the diagnosis of UMBILICAL HERNIA.  The various methods of treatment have been discussed with the patient and family. After consideration of risks, benefits and other options for treatment, the patient has consented to  Procedure(s): HERNIA REPAIR UMBILICAL ADULT WITH MESH (N/A) as a surgical intervention.  The patient's history has been reviewed, patient examined, no change in status, stable for surgery.  I have reviewed the patient's chart and labs.  Questions were answered to the patient's satisfaction.     Prisila Dlouhy A Tausha Milhoan

## 2022-11-20 NOTE — Anesthesia Procedure Notes (Signed)
Procedure Name: Intubation Date/Time: 11/20/2022 7:41 AM  Performed by: Burna Cash, CRNAPre-anesthesia Checklist: Patient identified, Emergency Drugs available, Suction available and Patient being monitored Patient Re-evaluated:Patient Re-evaluated prior to induction Oxygen Delivery Method: Circle system utilized Preoxygenation: Pre-oxygenation with 100% oxygen Induction Type: IV induction Ventilation: Mask ventilation without difficulty and Oral airway inserted - appropriate to patient size Laryngoscope Size: Mac and 3 Grade View: Grade III Tube type: Oral Tube size: 7.0 mm Number of attempts: 1 Airway Equipment and Method: Stylet, Oral airway and Bite block Placement Confirmation: ETT inserted through vocal cords under direct vision, positive ETCO2 and breath sounds checked- equal and bilateral Secured at: 22 cm Tube secured with: Tape Dental Injury: Teeth and Oropharynx as per pre-operative assessment

## 2022-11-20 NOTE — Anesthesia Postprocedure Evaluation (Signed)
Anesthesia Post Note  Patient: Heather Santos  Procedure(s) Performed: HERNIA REPAIR UMBILICAL ADULT WITH MESH (Abdomen)     Patient location during evaluation: PACU Anesthesia Type: General Level of consciousness: awake and alert Pain management: pain level controlled Vital Signs Assessment: post-procedure vital signs reviewed and stable Respiratory status: spontaneous breathing, nonlabored ventilation and respiratory function stable Cardiovascular status: blood pressure returned to baseline and stable Postop Assessment: no apparent nausea or vomiting Anesthetic complications: no   No notable events documented.  Last Vitals:  Vitals:   11/20/22 0915 11/20/22 0942  BP: (!) 134/59 (!) 136/57  Pulse: (!) 59 60  Resp: 20 16  Temp:  36.6 C  SpO2: 94% 96%    Last Pain:  Vitals:   11/20/22 0946  TempSrc:   PainSc: 6                  Lowella Curb

## 2022-11-20 NOTE — Anesthesia Preprocedure Evaluation (Addendum)
Anesthesia Evaluation  Patient identified by MRN, date of birth, ID band Patient awake    Reviewed: Allergy & Precautions, H&P , NPO status , Patient's Chart, lab work & pertinent test results  History of Anesthesia Complications (+) PONV and history of anesthetic complications  Airway Mallampati: II  TM Distance: >3 FB Neck ROM: Full    Dental no notable dental hx.    Pulmonary sleep apnea and Continuous Positive Airway Pressure Ventilation    Pulmonary exam normal breath sounds clear to auscultation       Cardiovascular hypertension, Pt. on medications Normal cardiovascular exam Rhythm:Regular Rate:Normal     Neuro/Psych    Depression    negative neurological ROS  negative psych ROS   GI/Hepatic negative GI ROS, Neg liver ROS,,,  Endo/Other  negative endocrine ROS    Renal/GU negative Renal ROS  negative genitourinary   Musculoskeletal negative musculoskeletal ROS (+)    Abdominal  (+) + obese  Peds negative pediatric ROS (+)  Hematology negative hematology ROS (+)   Anesthesia Other Findings   Reproductive/Obstetrics negative OB ROS                             Anesthesia Physical Anesthesia Plan  ASA: II  Anesthesia Plan: General   Post-op Pain Management: Tylenol PO (pre-op)*   Induction: Intravenous  PONV Risk Score and Plan: 4 or greater and Treatment may vary due to age, Ondansetron, Dexamethasone, Midazolam and Droperidol  Airway Management Planned: Oral ETT  Additional Equipment:   Intra-op Plan:   Post-operative Plan: Extubation in OR  Informed Consent: I have reviewed the patients History and Physical, chart, labs and discussed the procedure including the risks, benefits and alternatives for the proposed anesthesia with the patient or authorized representative who has indicated his/her understanding and acceptance.     Dental advisory given  Plan Discussed  with: CRNA  Anesthesia Plan Comments:         Anesthesia Quick Evaluation

## 2022-11-20 NOTE — Transfer of Care (Signed)
Immediate Anesthesia Transfer of Care Note  Patient: Heather Santos  Procedure(s) Performed: HERNIA REPAIR UMBILICAL ADULT WITH MESH (Abdomen)  Patient Location: PACU  Anesthesia Type:General  Level of Consciousness: drowsy  Airway & Oxygen Therapy: Patient Spontanous Breathing and Patient connected to face mask oxygen  Post-op Assessment: Report given to RN and Post -op Vital signs reviewed and stable  Post vital signs: Reviewed and stable  Last Vitals:  Vitals Value Taken Time  BP 150/61 (85)   Temp    Pulse 74 11/20/22 0838  Resp 21 11/20/22 0838  SpO2 98 % 11/20/22 0838  Vitals shown include unvalidated device data.  Last Pain:  Vitals:   11/20/22 7829  TempSrc: Oral  PainSc: 0-No pain      Patients Stated Pain Goal: 4 (11/20/22 5621)  Complications: No notable events documented.

## 2022-11-20 NOTE — Discharge Instructions (Addendum)
[facility] _______[facility]  UMBILICAL OR INGUINAL HERNIA REPAIR: POST OP INSTRUCTIONS  Always review your discharge instruction sheet given to you by the facility where your surgery was performed. IF YOU HAVE DISABILITY OR FAMILY LEAVE FORMS, YOU MUST BRING THEM TO THE OFFICE FOR PROCESSING.   DO NOT GIVE THEM TO YOUR DOCTOR.  1. A  prescription for pain medication may be given to you upon discharge.  Take your pain medication as prescribed, if needed.  If narcotic pain medicine is not needed, then you may take acetaminophen (Tylenol) or ibuprofen (Advil) as needed. No Tylenol until after 12:45pm today, if needed. May have Ibuprofen after 3:45 PM today 2. Take your usually prescribed medications unless otherwise directed. If you need a refill on your pain medication, please contact your pharmacy.  They will contact our office to request authorization. Prescriptions will not be filled after 5 pm or on week-ends. 3. You should follow a light diet the first 24 hours after arrival home, such as soup and crackers, etc.  Be sure to include lots of fluids daily.  Resume your normal diet the day after surgery. 4.Most patients will experience some swelling and bruising around the umbilicus or in the groin and scrotum.  Ice packs and reclining will help.  Swelling and bruising can take several days to resolve.  6. It is common to experience some constipation if taking pain medication after surgery.  Increasing fluid intake and taking a stool softener (such as Colace) will usually help or prevent this problem from occurring.  A mild laxative (Milk of Magnesia or Miralax) should be taken according to package directions if there are no bowel movements after 48 hours. 7. Unless discharge instructions indicate otherwise, you may remove your bandages 24-48 hours after surgery, and you may shower at that time.  You may have steri-strips (small skin tapes) in place directly over the incision.  These strips  should be left on the skin for 7-10 days.  If your surgeon used skin glue on the incision, you may shower in 24 hours.  The glue will flake off over the next 2-3 weeks.  Any sutures or staples will be removed at the office during your follow-up visit. 8. ACTIVITIES:  You may resume regular (light) daily activities beginning the next day--such as daily self-care, walking, climbing stairs--gradually increasing activities as tolerated.  You may have sexual intercourse when it is comfortable.  Refrain from any heavy lifting or straining until approved by your doctor.  a.You may drive when you are no longer taking prescription pain medication, you can comfortably wear a seatbelt, and you can safely maneuver your car and apply brakes. b.RETURN TO WORK:   _____________________________________________  9.You should see your doctor in the office for a follow-up appointment approximately 2-3 weeks after your surgery.  Make sure that you call for this appointment within a day or two after you arrive home to insure a convenient appointment time. 10.OTHER INSTRUCTIONS: _________________________    _____________________________________  WHEN TO CALL YOUR DOCTOR: Fever over 101.0 Inability to urinate Nausea and/or vomiting Extreme swelling or bruising Continued bleeding from incision. Increased pain, redness, or drainage from the incision  The clinic staff is available to answer your questions during regular business hours.  Please don't hesitate to call and ask to speak to one of the nurses for clinical concerns.  If you have a medical emergency, go to the nearest emergency room or call 911.  A surgeon from [facility]  is always on call at the hospital   [address], [place], [place]  [address] ?  [address] [address], [place], [place]   [address] [phone] ? [phone] ? FAX [phone] Web site: [email or web address]    Post Anesthesia Home Care  Instructions  Activity: Get plenty of rest for the remainder of the day. A responsible individual must stay with you for 24 hours following the procedure.  For the next 24 hours, DO NOT: -Drive a car -Advertising copywriter -Drink alcoholic beverages -Take any medication unless instructed by your physician -Make any legal decisions or sign important papers.  Meals: Start with liquid foods such as gelatin or soup. Progress to regular foods as tolerated. Avoid greasy, spicy, heavy foods. If nausea and/or vomiting occur, drink only clear liquids until the nausea and/or vomiting subsides. Call your physician if vomiting continues.  Special Instructions/Symptoms: Your throat may feel dry or sore from the anesthesia or the breathing tube placed in your throat during surgery. If this causes discomfort, gargle with warm salt water. The discomfort should disappear within 24 hours.  If you had a scopolamine patch placed behind your ear for the management of post- operative nausea and/or vomiting:  1. The medication in the patch is effective for 72 hours, after which it should be removed.  Wrap patch in a tissue and discard in the trash. Wash hands thoroughly with soap and water. 2. You may remove the patch earlier than 72 hours if you experience unpleasant side effects which may include dry mouth, dizziness or visual disturbances. 3. Avoid touching the patch. Wash your hands with soap and water after contact with the patch.

## 2022-11-20 NOTE — Op Note (Signed)
Preoperative diagnosis: Umbilical hernia initial reducible measuring 2 cm in maximal diameter  Postoperative diagnosis: Same  Procedure: Repair of umbilical hernia with 4.3 cm circular coated Ventralex mesh  Surgeon: Harriette Bouillon, MD  Assistant: Dr. Bernarda Caffey MD I was personally present during the key and critical portions of this procedure and immediately available throughout the entire procedure, as documented in my operative note.   Anesthesia: General with 0.25% Marcaine plain  EBL: Minimal  Specimen: None  Drains: None  IV fluids: Per anesthesia record  Indications for procedure: The patient is a 77 year old female with a reducible umbilical hernia causing pain.  She presents today for repair.  We discussed the use of mesh as well if necessary and the pros and cons of repair versus observation and nonoperative management.  We discussed techniques of repair including laparoscopic and open.The risk of hernia repair include bleeding,  Infection,   Recurrence of the hernia,  Mesh use, chronic pain,  Organ injury,  Bowel injury,  Bladder injury,   nerve injury with numbness around the incision,  Death,  and worsening of preexisting  medical problems.  The alternatives to surgery have been discussed as well..  Long term expectations of both operative and non operative treatments have been discussed.   The patient agrees to proceed.     Description of procedure: The patient was met in the holding area and questions answered.  She was taken back to the operating room placed supine upon the operating room table.  After induction of general anesthesia, the periumbilical region was prepped and draped in a sterile fashion a timeout performed.  Proper patient, site and procedure verified.  Local anesthetic was infiltrated along the inferior border of the umbilicus.  A curvilinear incision was made.  Dissection was carried down and the hernia sac was densely adherent to the underlying skin of the  umbilicus.  We carefully dissected through this but in order to get the sac to now we did have to dissect up into the skin and excised a small ellipse of skin to reduce the hernia sac down.  Once we did this we could define the fascial defect is being about 2 cm.  There is some omentum.  This was carefully placed back in the abdominal cavity.  There is no bowel or other internal viscera that I could see.  I then used Kocher's to elevate the fascia and clear off the fascial edges with cautery.  A 4.3 cm circular Ventralex mesh was used and placed in a subfascial position.  We then secured this to the undersurface of the fascia using 0 Novafil suture circumferentially.  This was then carefully under direct vision.  Once this was closed we closed the fascia with the same 2-0 Novafil suture.  Hemostasis was excellent.  We then cleaned up the edges of the umbilical skin and closed this with 2-0 Vicryl and 4-0 Monocryl.  This recreated a new umbilicus.  I then tacked this down to the fascia with 2-0 Vicryl.  We removed any excessive skin in the process.  We then closed the deep tissue layers with 2-0 Vicryl.  4-0 Monocryl used to close skin.  Dermabond applied.  All counts found to be correct.  The patient was awoke extubated taken recovery in satisfactory condition.

## 2022-11-21 ENCOUNTER — Encounter (HOSPITAL_BASED_OUTPATIENT_CLINIC_OR_DEPARTMENT_OTHER): Payer: Self-pay | Admitting: Surgery

## 2022-12-07 DIAGNOSIS — L299 Pruritus, unspecified: Secondary | ICD-10-CM | POA: Diagnosis not present

## 2022-12-07 DIAGNOSIS — M810 Age-related osteoporosis without current pathological fracture: Secondary | ICD-10-CM | POA: Diagnosis not present

## 2022-12-07 DIAGNOSIS — E1169 Type 2 diabetes mellitus with other specified complication: Secondary | ICD-10-CM | POA: Diagnosis not present

## 2022-12-07 DIAGNOSIS — E119 Type 2 diabetes mellitus without complications: Secondary | ICD-10-CM | POA: Diagnosis not present

## 2022-12-07 DIAGNOSIS — I10 Essential (primary) hypertension: Secondary | ICD-10-CM | POA: Diagnosis not present

## 2022-12-20 DIAGNOSIS — I10 Essential (primary) hypertension: Secondary | ICD-10-CM | POA: Diagnosis not present

## 2022-12-20 DIAGNOSIS — G4733 Obstructive sleep apnea (adult) (pediatric): Secondary | ICD-10-CM | POA: Diagnosis not present

## 2023-06-12 DIAGNOSIS — E119 Type 2 diabetes mellitus without complications: Secondary | ICD-10-CM | POA: Diagnosis not present

## 2023-06-12 DIAGNOSIS — I10 Essential (primary) hypertension: Secondary | ICD-10-CM | POA: Diagnosis not present

## 2023-06-12 DIAGNOSIS — G4733 Obstructive sleep apnea (adult) (pediatric): Secondary | ICD-10-CM | POA: Diagnosis not present

## 2023-06-12 DIAGNOSIS — E1169 Type 2 diabetes mellitus with other specified complication: Secondary | ICD-10-CM | POA: Diagnosis not present

## 2023-06-12 DIAGNOSIS — M810 Age-related osteoporosis without current pathological fracture: Secondary | ICD-10-CM | POA: Diagnosis not present

## 2023-06-12 DIAGNOSIS — K7581 Nonalcoholic steatohepatitis (NASH): Secondary | ICD-10-CM | POA: Diagnosis not present

## 2023-06-12 DIAGNOSIS — Z0000 Encounter for general adult medical examination without abnormal findings: Secondary | ICD-10-CM | POA: Diagnosis not present

## 2023-06-12 DIAGNOSIS — Z23 Encounter for immunization: Secondary | ICD-10-CM | POA: Diagnosis not present

## 2023-06-12 DIAGNOSIS — L659 Nonscarring hair loss, unspecified: Secondary | ICD-10-CM | POA: Diagnosis not present

## 2023-07-25 ENCOUNTER — Other Ambulatory Visit: Payer: Self-pay | Admitting: Family Medicine

## 2023-07-25 DIAGNOSIS — K429 Umbilical hernia without obstruction or gangrene: Secondary | ICD-10-CM

## 2023-07-31 ENCOUNTER — Ambulatory Visit
Admission: RE | Admit: 2023-07-31 | Discharge: 2023-07-31 | Disposition: A | Discharge status: Home / Self Care | Payer: Medicare Other | Source: Ambulatory Visit | Attending: Family Medicine | Admitting: Family Medicine

## 2023-07-31 DIAGNOSIS — I700 Atherosclerosis of aorta: Secondary | ICD-10-CM | POA: Diagnosis not present

## 2023-07-31 DIAGNOSIS — K429 Umbilical hernia without obstruction or gangrene: Secondary | ICD-10-CM | POA: Diagnosis not present

## 2023-07-31 DIAGNOSIS — R109 Unspecified abdominal pain: Secondary | ICD-10-CM | POA: Diagnosis not present

## 2023-07-31 DIAGNOSIS — K449 Diaphragmatic hernia without obstruction or gangrene: Secondary | ICD-10-CM | POA: Diagnosis not present

## 2023-08-02 DIAGNOSIS — Z1231 Encounter for screening mammogram for malignant neoplasm of breast: Secondary | ICD-10-CM | POA: Diagnosis not present

## 2023-08-02 DIAGNOSIS — L649 Androgenic alopecia, unspecified: Secondary | ICD-10-CM | POA: Diagnosis not present

## 2023-09-02 ENCOUNTER — Ambulatory Visit: Payer: Self-pay | Admitting: Surgery

## 2023-09-02 DIAGNOSIS — K432 Incisional hernia without obstruction or gangrene: Secondary | ICD-10-CM | POA: Diagnosis not present

## 2023-09-13 DIAGNOSIS — J988 Other specified respiratory disorders: Secondary | ICD-10-CM | POA: Diagnosis not present

## 2023-09-25 DIAGNOSIS — H524 Presbyopia: Secondary | ICD-10-CM | POA: Diagnosis not present

## 2023-09-25 DIAGNOSIS — Z961 Presence of intraocular lens: Secondary | ICD-10-CM | POA: Diagnosis not present

## 2023-09-25 DIAGNOSIS — H35363 Drusen (degenerative) of macula, bilateral: Secondary | ICD-10-CM | POA: Diagnosis not present

## 2023-09-25 DIAGNOSIS — E119 Type 2 diabetes mellitus without complications: Secondary | ICD-10-CM | POA: Diagnosis not present

## 2023-09-26 DIAGNOSIS — R0981 Nasal congestion: Secondary | ICD-10-CM | POA: Diagnosis not present

## 2023-09-26 DIAGNOSIS — I10 Essential (primary) hypertension: Secondary | ICD-10-CM | POA: Diagnosis not present

## 2023-11-04 ENCOUNTER — Ambulatory Visit: Attending: Cardiology | Admitting: Cardiology

## 2023-11-04 ENCOUNTER — Encounter: Payer: Self-pay | Admitting: Cardiology

## 2023-11-04 VITALS — BP 160/72 | HR 60 | Ht 64.0 in | Wt 189.2 lb

## 2023-11-04 DIAGNOSIS — I10 Essential (primary) hypertension: Secondary | ICD-10-CM

## 2023-11-04 DIAGNOSIS — G4733 Obstructive sleep apnea (adult) (pediatric): Secondary | ICD-10-CM

## 2023-11-04 NOTE — Patient Instructions (Signed)
 Medication Instructions:  Your physician recommends that you continue on your current medications as directed. Please refer to the Current Medication list given to you today.  *If you need a refill on your cardiac medications before your next appointment, please call your pharmacy*  Lab Work: NONE If you have labs (blood work) drawn today and your tests are completely normal, you will receive your results only by: MyChart Message (if you have MyChart) OR A paper copy in the mail If you have any lab test that is abnormal or we need to change your treatment, we will call you to review the results.  Testing/Procedures: NONE  Follow-Up: At [facility], you and your health needs are our priority.  As part of our continuing mission to provide you with exceptional heart care, our providers are all part of one team.  This team includes your primary Cardiologist (physician) and Advanced Practice Providers or APPs (Physician Assistants and Nurse Practitioners) who all work together to provide you with the care you need, when you need it.  Your next appointment:   1 year(s)  Provider:   Micael Adas, MD  We recommend signing up for the patient portal called "MyChart".  Sign up information is provided on this After Visit Summary.  MyChart is used to connect with patients for Virtual Visits (Telemedicine).  Patients are able to view lab/test results, encounter notes, upcoming appointments, etc.  Non-urgent messages can be sent to your provider as well.   To learn more about what you can do with MyChart, go to [email or web address].   Other Instructions

## 2023-11-04 NOTE — Progress Notes (Signed)
 Date:  11/04/2023   ID:  Heather Santos, DOB April 19, 1946, MRN [id number]  PCP:  Glena Landau, MD  Sleep Medicine:  Gaylyn Keas, MD Electrophysiologist:  None   Chief Complaint:  OSA  History of Present Illness:    Heather Santos is a 78 y.o. female with a hx of  severe OSA on CPAP, HTN and obesity.  She is doing well with her PAP device and thinks that she has gotten used to it.  She tolerates the nasal mask and feels the pressure is adequate.  Since going on PAP she feels rested in the am and has no significant daytime sleepiness if she sleeps well the night before.  She denies any significant  nasal dryness or nasal congestion.  She has had some problems with mouth dryness. She does not think that she snores.  She has had a lot of problems over the past months with allergies and then a URI which has made it difficult to use her device.   Prior CV studies:   The following studies were reviewed today:  PAP compliance download  Past Medical History:  Diagnosis Date   Fatty liver 04/2012   on U/S w elevated LFTs   History of shingles    Hypertension    Major depression in remission (HCC)    Obesity    OSA (obstructive sleep apnea)    w CPAP AHI 35/hr now on CPAP at 16 cm H2O   Osteoporosis    started prolia  2012   PONV (postoperative nausea and vomiting)    Past Surgical History:  Procedure Laterality Date   COLONOSCOPY WITH PROPOFOL  N/A 12/04/2016   Procedure: COLONOSCOPY WITH PROPOFOL ;  Surgeon: Tami Falcon, MD;  Location: [facility];  Service: Endoscopy;  Laterality: N/A;   HERNIA REPAIR     TONSILLECTOMY     UMBILICAL HERNIA REPAIR N/A 11/20/2022   Procedure: HERNIA REPAIR UMBILICAL ADULT WITH MESH;  Surgeon: Sim Dryer, MD;  Location:  SURGERY CENTER;  Service: General;  Laterality: N/A;     No outpatient medications have been marked as taking for the 11/04/23 encounter (Office Visit) with Jacqueline Matsu, MD.     Allergies:   Augmentin [amoxicillin-pot  clavulanate]   Social History   Tobacco Use   Smoking status: Never   Smokeless tobacco: Never  Substance Use Topics   Alcohol use: Yes    Comment: socailly   Drug use: No     Family Hx: The patient's family history includes CAD in her father.  ROS:   Please see the history of present illness.     All other systems reviewed and are negative.   Labs/Other Tests and Data Reviewed:    Recent Labs: No results found for requested labs within last 365 days.   Recent Lipid Panel No results found for: "CHOL", "TRIG", "HDL", "CHOLHDL", "LDLCALC", "LDLDIRECT"  Wt Readings from Last 3 Encounters:  11/20/22 182 lb 15.7 oz (83 kg)  06/01/22 182 lb (82.6 kg)  10/28/20 201 lb (91.2 kg)     Objective:    Vital Signs:  There were no vitals taken for this visit.  GEN: Well nourished, well developed in no acute distress HEENT: Normal NECK: No JVD; No carotid bruits LYMPHATICS: No lymphadenopathy CARDIAC:RRR, no murmurs, rubs, gallops RESPIRATORY:  Clear to auscultation without rales, wheezing or rhonchi  ABDOMEN: Soft, non-tender, non-distended MUSCULOSKELETAL:  trace edema; No deformity  SKIN: Warm and dry NEUROLOGIC:  Alert and oriented x 3 PSYCHIATRIC:  Normal affect  ASSESSMENT & PLAN:    1. OSA - The patient is tolerating PAP therapy well without any problems. The PAP download performed by his DME was personally reviewed and interpreted by me today and showed an AHI of 2.1 /hr on 10 cm H2O with 47% compliance in using more than 4 hours nightly.  The patient has been using and benefiting from PAP use and will continue to benefit from therapy.  -her compliance is down recently due to problems with allergies and recent URI -I will add on a chin strap for mouth dryness -order new PAP supplies  2.  HTN -BP elevated on exam today -BP at home runs 120-130 systolic>>she has known white coat HTN  Medication Adjustments/Labs and Tests Ordered: Current medicines are reviewed at  length with t2he patient today.  Concerns regarding medicines are outlined above.  Tests Ordered: No orders of the defined types were placed in this encounter.  Medication Changes: No orders of the defined types were placed in this encounter.   Disposition:  Follow up in 1 year(s)  Signed, Gaylyn Keas, MD  11/04/2023 11:37 AM    [facility]

## 2023-11-06 ENCOUNTER — Other Ambulatory Visit: Payer: Self-pay

## 2023-11-06 DIAGNOSIS — I10 Essential (primary) hypertension: Secondary | ICD-10-CM

## 2023-11-06 DIAGNOSIS — G4733 Obstructive sleep apnea (adult) (pediatric): Secondary | ICD-10-CM

## 2023-11-06 NOTE — Progress Notes (Signed)
 Order for Chin strap and PAP supplies sent to [facility] today. Called patient and notified that order has been sent to [facility].

## 2023-11-13 ENCOUNTER — Encounter (HOSPITAL_COMMUNITY): Payer: Self-pay

## 2023-11-13 NOTE — Progress Notes (Signed)
 Surgical Instructions    Your procedure is scheduled on Wednesday, 11/27/23.  Report to [facility] Main Entrance "A" at 6:30 A.M., then check in with the Admitting office.  Call this number if you have problems the morning of surgery:  [phone]   If you have any questions prior to your surgery date call [phone]: Open Monday-Friday 8am-4pm If you experience any cold or flu symptoms such as cough, fever, chills, shortness of breath, etc. between now and your scheduled surgery, please notify us  at the above number     Remember:  Do not eat after midnight the night before your surgery-Tuesday  You may drink clear liquids until 5:30 the morning of your surgery-Wednesday.   Clear liquids allowed are: Water, Non-Citrus Juices (without pulp), Carbonated Beverages, Clear Tea, Black Coffee ONLY (NO MILK, CREAM OR POWDERED CREAMER of any kind), and Gatorade    Take these medicines the morning of surgery with A SIP OF WATER:  fluticasone (FLONASE)  IF NEEDED   As of today, STOP taking any Aspirin (unless otherwise instructed by your surgeon) Aleve, Naproxen, Ibuprofen , Motrin , Advil , Goody's, BC's, all herbal medications, fish oil, and all vitamins.           Do not wear jewelry or makeup. Do not wear lotions, powders, perfumes or deodorant. Do not shave 48 hours prior to surgery.   Do not bring valuables to the hospital. Do not wear nail polish, gel polish, artificial nails, or any other type of covering on natural nails (fingers and toes) If you have artificial nails or gel coating that need to be removed by a nail salon, please have this removed prior to surgery. Artificial nails or gel coating may interfere with anesthesia's ability to adequately monitor your vital signs.  [facility] is not responsible for any belongings or valuables.    Do NOT Smoke (Tobacco/Vaping)  24 hours prior to your procedure  If you use a CPAP at night, you may bring your mask for your overnight stay.    Contacts, glasses, hearing aids, dentures or partials may not be worn into surgery, please bring cases for these belongings   For patients admitted to the hospital, discharge time will be determined by your treatment team.   Patients discharged the day of surgery will not be allowed to drive home, and someone needs to stay with them for 24 hours.   SURGICAL WAITING ROOM VISITATION Patients having surgery or a procedure may have no more than 2 support people in the waiting area - these visitors may rotate.   Children under the age of 59 must have an adult with them who is not the patient. If the patient needs to stay at the hospital during part of their recovery, the visitor guidelines for inpatient rooms apply. Pre-op nurse will coordinate an appropriate time for 1 support person to accompany patient in pre-op.  This support person may not rotate.   Please refer to [email or web address] for the visitor guidelines for Inpatients (after your surgery is over and you are in a regular room).    Special instructions:    Oral Hygiene is also important to reduce your risk of infection.  Remember - BRUSH YOUR TEETH THE MORNING OF SURGERY WITH YOUR REGULAR TOOTHPASTE   [facility]- Preparing For Surgery  Before surgery, you can play an important role. Because skin is not sterile, your skin needs to be as free of germs as possible. You can reduce the number of germs on your skin by  washing with CHG (chlorahexidine gluconate) Soap before surgery.  CHG is an antiseptic cleaner which kills germs and bonds with the skin to continue killing germs even after washing.     Please do not use if you have an allergy to CHG or antibacterial soaps. If your skin becomes reddened/irritated stop using the CHG.  Do not shave (including legs and underarms) for at least 48 hours prior to first CHG shower. It is OK to shave your face.  Please follow these instructions  carefully.     Shower the NIGHT BEFORE SURGERY-Tues and the MORNING OF SURGERY-Wed with CHG Soap.   If you chose to wash your hair, wash your hair first as usual with your normal shampoo. After you shampoo, rinse your hair and body thoroughly to remove the shampoo.  Then [employer] and genitals (private parts) with your normal soap and rinse thoroughly to remove soap.  After that Use CHG Soap as you would any other liquid soap. You can apply CHG directly to the skin and wash gently with a scrungie or a clean washcloth.   Apply the CHG Soap to your body ONLY FROM THE NECK DOWN.  Do not use on open wounds or open sores. Avoid contact with your eyes, ears, mouth and genitals (private parts). Wash Face and genitals (private parts)  with your normal soap.   Wash thoroughly, paying special attention to the area where your surgery will be performed.  Thoroughly rinse your body with warm water from the neck down.  DO NOT shower/wash with your normal soap after using and rinsing off the CHG Soap.  Pat yourself dry with a CLEAN TOWEL.  Wear CLEAN PAJAMAS to bed the night before surgery  Place CLEAN SHEETS on your bed the night before your surgery  DO NOT SLEEP WITH PETS.   Day of Surgery:  Take a shower with CHG soap. Wear Clean/Comfortable clothing the morning of surgery Do not apply any deodorants/lotions.   Remember to brush your teeth WITH YOUR REGULAR TOOTHPASTE.    If you received a COVID test during your pre-op visit, it is requested that you wear a mask when out in public, stay away from anyone that may not be feeling well, and notify your surgeon if you develop symptoms. If you have been in contact with anyone that has tested positive in the last 10 days, please notify your surgeon.    Please read over the following fact sheets that you were given.

## 2023-11-14 ENCOUNTER — Encounter (HOSPITAL_COMMUNITY)
Admission: RE | Admit: 2023-11-14 | Discharge: 2023-11-14 | Disposition: A | Discharge status: Home / Self Care | Source: Ambulatory Visit | Attending: Surgery | Admitting: Surgery

## 2023-11-14 ENCOUNTER — Encounter (HOSPITAL_COMMUNITY): Payer: Self-pay

## 2023-11-14 ENCOUNTER — Other Ambulatory Visit: Payer: Self-pay

## 2023-11-14 VITALS — BP 182/60 | HR 59 | Temp 98.0°F | Resp 17 | Ht 64.0 in | Wt 191.0 lb

## 2023-11-14 DIAGNOSIS — Z01818 Encounter for other preprocedural examination: Secondary | ICD-10-CM | POA: Diagnosis not present

## 2023-11-14 DIAGNOSIS — I10 Essential (primary) hypertension: Secondary | ICD-10-CM | POA: Diagnosis not present

## 2023-11-14 DIAGNOSIS — E119 Type 2 diabetes mellitus without complications: Secondary | ICD-10-CM | POA: Diagnosis not present

## 2023-11-14 DIAGNOSIS — M1990 Unspecified osteoarthritis, unspecified site: Secondary | ICD-10-CM | POA: Insufficient documentation

## 2023-11-14 DIAGNOSIS — K760 Fatty (change of) liver, not elsewhere classified: Secondary | ICD-10-CM | POA: Diagnosis not present

## 2023-11-14 DIAGNOSIS — Z01810 Encounter for preprocedural cardiovascular examination: Secondary | ICD-10-CM | POA: Diagnosis present

## 2023-11-14 DIAGNOSIS — I440 Atrioventricular block, first degree: Secondary | ICD-10-CM | POA: Diagnosis not present

## 2023-11-14 DIAGNOSIS — G4733 Obstructive sleep apnea (adult) (pediatric): Secondary | ICD-10-CM | POA: Insufficient documentation

## 2023-11-14 DIAGNOSIS — Z01812 Encounter for preprocedural laboratory examination: Secondary | ICD-10-CM | POA: Diagnosis present

## 2023-11-14 DIAGNOSIS — K432 Incisional hernia without obstruction or gangrene: Secondary | ICD-10-CM | POA: Diagnosis not present

## 2023-11-14 HISTORY — DX: Type 2 diabetes mellitus without complications: E11.9

## 2023-11-14 HISTORY — DX: Unspecified osteoarthritis, unspecified site: M19.90

## 2023-11-14 LAB — CBC
HCT: 38.2 % (ref 36.0–46.0)
Hemoglobin: 12.5 g/dL (ref 12.0–15.0)
MCH: 30.9 pg (ref 26.0–34.0)
MCHC: 32.7 g/dL (ref 30.0–36.0)
MCV: 94.6 fL (ref 80.0–100.0)
Platelets: 219 10*3/uL (ref 150–400)
RBC: 4.04 MIL/uL (ref 3.87–5.11)
RDW: 14 % (ref 11.5–15.5)
WBC: 8.6 10*3/uL (ref 4.0–10.5)
nRBC: 0 % (ref 0.0–0.2)

## 2023-11-14 LAB — BASIC METABOLIC PANEL WITH GFR
Anion gap: 8 (ref 5–15)
BUN: 17 mg/dL (ref 8–23)
CO2: 26 mmol/L (ref 22–32)
Calcium: 9.1 mg/dL (ref 8.9–10.3)
Chloride: 108 mmol/L (ref 98–111)
Creatinine, Ser: 0.84 mg/dL (ref 0.44–1.00)
GFR, Estimated: >60 mL/min (ref >60)
Glucose, Bld: 127 mg/dL — ABNORMAL HIGH (ref 70–99)
Potassium: 4.1 mmol/L (ref 3.5–5.1)
Sodium: 142 mmol/L (ref 135–145)

## 2023-11-14 LAB — GLUCOSE, CAPILLARY: Glucose-Capillary: 135 mg/dL — ABNORMAL HIGH (ref 70–99)

## 2023-11-14 NOTE — Progress Notes (Signed)
 PCP - Dr. Glena Landau Cardiologist - Dr. Tamala Fair, LOV 11/04/2023  PPM/ICD - denies Device Orders - na Rep Notified - na  Chest x-ray - na EKG - PAT, 11/14/2023 Stress Test -  ECHO -  Cardiac Cath -   Sleep Study - OSA with CPAP  Type II Diabetes. Blood sugar 135 at PAT appointment. Takes no medications Checks blood sugar: twice a week Typical range: 99-120  Blood Thinner Instructions:denies Aspirin Instructions: Will check with surgeon as when to stop  ERAS Protcol - Clears until 0530  Anesthesia review: Yes. HTN, DM, OSA with CPAP, obesity  Patient denies shortness of breath, fever, cough and chest pain at PAT appointment   All instructions explained to the patient, with a verbal understanding of the material. Patient agrees to go over the instructions while at home for a better understanding. Patient also instructed to self quarantine after being tested for COVID-19. The opportunity to ask questions was provided.

## 2023-11-14 NOTE — Progress Notes (Signed)
 Surgical Instructions    Your procedure is scheduled on Wednesday, 11/27/23.  Report to [facility] Main Entrance "A" at 6:30 A.M., then check in with the Admitting office.  Call this number if you have problems the morning of surgery:  [phone]   If you have any questions prior to your surgery date call [phone]: Open Monday-Friday 8am-4pm If you experience any cold or flu symptoms such as cough, fever, chills, shortness of breath, etc. between now and your scheduled surgery, please notify us  at the above number     Remember:  Do not eat after midnight the night before your surgery-Tuesday  You may drink clear liquids until 5:30 the morning of your surgery-Wednesday.   Clear liquids allowed are: Water, Non-Citrus Juices (without pulp), Carbonated Beverages, Clear Tea, Black Coffee ONLY (NO MILK, CREAM OR POWDERED CREAMER of any kind), and Gatorade    Take these medicines if needed the morning of surgery with A SIP OF WATER:  fluticasone (FLONASE)  Follow your surgeon's instructions on when to stop Asprin.  If no instructions were given by your surgeon then you will need to call the office to get those instructions.     As of today, STOP taking any Aleve, Naproxen, Ibuprofen , Motrin , Advil , Goody's, BC's, all herbal medications, fish oil, and all vitamins.           Do not wear jewelry or makeup. Do not wear lotions, powders, perfumes or deodorant. Do not shave 48 hours prior to surgery.   Do not bring valuables to the hospital. Do not wear nail polish, gel polish, artificial nails, or any other type of covering on natural nails (fingers and toes) If you have artificial nails or gel coating that need to be removed by a nail salon, please have this removed prior to surgery. Artificial nails or gel coating may interfere with anesthesia's ability to adequately monitor your vital signs.  [facility] is not responsible for any belongings or valuables.    Do NOT Smoke  (Tobacco/Vaping)  24 hours prior to your procedure  If you use a CPAP at night, you may bring your mask for your overnight stay.   Contacts, glasses, hearing aids, dentures or partials may not be worn into surgery, please bring cases for these belongings   For patients admitted to the hospital, discharge time will be determined by your treatment team.   Patients discharged the day of surgery will not be allowed to drive home, and someone needs to stay with them for 24 hours.   SURGICAL WAITING ROOM VISITATION Patients having surgery or a procedure may have no more than 2 support people in the waiting area - these visitors may rotate.   Children under the age of 73 must have an adult with them who is not the patient. If the patient needs to stay at the hospital during part of their recovery, the visitor guidelines for inpatient rooms apply. Pre-op nurse will coordinate an appropriate time for 1 support person to accompany patient in pre-op.  This support person may not rotate.   Please refer to [email or web address] for the visitor guidelines for Inpatients (after your surgery is over and you are in a regular room).    Special instructions:    Oral Hygiene is also important to reduce your risk of infection.  Remember - BRUSH YOUR TEETH THE MORNING OF SURGERY WITH YOUR REGULAR TOOTHPASTE   [place]- Preparing For Surgery  Before surgery, you can play an important role. Because skin  is not sterile, your skin needs to be as free of germs as possible. You can reduce the number of germs on your skin by washing with CHG (chlorahexidine gluconate) Soap before surgery.  CHG is an antiseptic cleaner which kills germs and bonds with the skin to continue killing germs even after washing.     Please do not use if you have an allergy to CHG or antibacterial soaps. If your skin becomes reddened/irritated stop using the CHG.  Do not shave (including  legs and underarms) for at least 48 hours prior to first CHG shower. It is OK to shave your face.  Please follow these instructions carefully.     Shower the NIGHT BEFORE SURGERY-Tues and the MORNING OF SURGERY-Wed with CHG Soap.   If you chose to wash your hair, wash your hair first as usual with your normal shampoo. After you shampoo, rinse your hair and body thoroughly to remove the shampoo.  Then [employer] and genitals (private parts) with your normal soap and rinse thoroughly to remove soap.  After that Use CHG Soap as you would any other liquid soap. You can apply CHG directly to the skin and wash gently with a scrungie or a clean washcloth.   Apply the CHG Soap to your body ONLY FROM THE NECK DOWN.  Do not use on open wounds or open sores. Avoid contact with your eyes, ears, mouth and genitals (private parts). Wash Face and genitals (private parts)  with your normal soap.   Wash thoroughly, paying special attention to the area where your surgery will be performed.  Thoroughly rinse your body with warm water from the neck down.  DO NOT shower/wash with your normal soap after using and rinsing off the CHG Soap.  Pat yourself dry with a CLEAN TOWEL.  Wear CLEAN PAJAMAS to bed the night before surgery  Place CLEAN SHEETS on your bed the night before your surgery  DO NOT SLEEP WITH PETS.   Day of Surgery:  Take a shower with CHG soap. Wear Clean/Comfortable clothing the morning of surgery Do not apply any deodorants/lotions.   Remember to brush your teeth WITH YOUR REGULAR TOOTHPASTE.    If you received a COVID test during your pre-op visit, it is requested that you wear a mask when out in public, stay away from anyone that may not be feeling well, and notify your surgeon if you develop symptoms. If you have been in contact with anyone that has tested positive in the last 10 days, please notify your surgeon.    Please read over the following fact sheets that you were given.

## 2023-11-15 NOTE — Progress Notes (Signed)
 Anesthesia Chart Review:  Case: [id number] Date/Time: 11/27/23 0815   Procedure: REPAIR, HERNIA, INCISIONAL, LAPAROSCOPIC - recurrent   Anesthesia type: General   Diagnosis: Recurrent incisional hernia with complication [K43.2]   Pre-op diagnosis: RECURRENT INCISIONAL HERNIA   [facility]: [facility] ROOM 02 / [facility]   Surgeons: Sim Dryer, MD       DISCUSSION: Patient is a 78 year old female scheduled for the above procedure.  History includes never smoker, post-operative N/V, HTN (with white coat HTN), fatty liver, OSA (uses CPAP), DM2 (diet controlled), arthritis, umbilical hernia (s/p repair 11/20/22).   She is followed by cardiologist Dr. Micael Adas for OSA and HTN, last visit 11/04/23. PAP downloaded showed "AHI of 2.1 /hr on 10 cm H2O with 47% compliance in using more than 4 hours nightly." Compliance had been down recently due to allergies and URI in March. She noted that patient has white coat HTN with home SBP readings typical ~ 120's-130's.  One year follow-up planned.   A1c was not done with PAT labs. She checks home CBGs ~ 2x/week with range of ~ 99-120s. Non-fasting serum glucose 11/14/23 was 127. DM is diet controlled. She will get a CBG on arrival for surgery.   Anesthesia team to evaluate on the day of surgery.   VS: BP (!) 182/60   Pulse (!) 59   Temp 36.7 C   Resp 17   Ht 5\' 4"  (1.626 m)   Wt 86.6 kg   SpO2 96%   BMI 32.79 kg/m  BP Readings from Last 3 Encounters:  11/14/23 (!) 182/60  11/04/23 (!) 160/72  11/20/22 (!) 136/57     PROVIDERS: Glena Landau, MD is PCP  Gaylyn Keas, MD is cardiologist (for OSA and HTN)   LABS: Labs reviewed: Acceptable for surgery. (all labs ordered are listed, but only abnormal results are displayed)  Labs Reviewed  GLUCOSE, CAPILLARY - Abnormal; Notable for the following components:      Result Value   Glucose-Capillary 135 (*)    All other components within normal limits  BASIC METABOLIC PANEL WITH GFR - Abnormal; Notable for  the following components:   Glucose, Bld 127 (*)    All other components within normal limits  CBC     IMAGES: CT Abd/pelvis 07/31/23: IMPRESSION: Small sliding-type hiatal hernia. Small fat containing periumbilical hernia. Aortic Atherosclerosis (ICD10-I70.0).   EKG: 11/14/23: SB at 52 bpm with sinus arrhythmia. First degree AV block (PR 336 ms).  - Comparison EKG in [facility] from 06/06/04 showed NSR at 66 bpm, PR 172 ms.    CV: N/A  Past Medical History:  Diagnosis Date   Arthritis    Diabetes mellitus without complication (HCC)    Fatty liver 04/2012   on U/S w elevated LFTs   History of shingles    Hypertension    Major depression in remission (HCC)    Obesity    OSA (obstructive sleep apnea)    w CPAP AHI 35/hr now on CPAP at 16 cm H2O   Osteoporosis    started prolia  2012   PONV (postoperative nausea and vomiting)     Past Surgical History:  Procedure Laterality Date   COLONOSCOPY WITH PROPOFOL  N/A 12/04/2016   Procedure: COLONOSCOPY WITH PROPOFOL ;  Surgeon: Tami Falcon, MD;  Location: [facility];  Service: Endoscopy;  Laterality: N/A;   HERNIA REPAIR     TONSILLECTOMY     UMBILICAL HERNIA REPAIR N/A 11/20/2022   Procedure: HERNIA REPAIR UMBILICAL ADULT WITH MESH;  Surgeon: Sim Dryer,  MD;  Location: [facility];  Service: General;  Laterality: N/A;    MEDICATIONS:  aspirin EC 81 MG tablet   BIOTIN PO   Cholecalciferol (VITAMIN D3) 1000 UNIT/SPRAY LIQD   denosumab  (PROLIA ) 60 MG/ML SOLN injection   fluticasone (FLONASE) 50 MCG/ACT nasal spray   ibuprofen  (ADVIL ) 200 MG tablet   Multiple Vitamin (MULTIVITAMIN) tablet   No current facility-administered medications for this encounter.  Advised to follow-up with surgeon regarding perioperative ASA instructions.   Ella Gun, PA-C Surgical Short Stay/Anesthesiology [facility] Phone [phone] [facility] Phone [phone] 11/15/2023 3:54 PM

## 2023-11-15 NOTE — H&P (Signed)
 History of Present Illness: Heather Santos is a 78 y.o. female who is seen today for follow-up. She had an open umbilical hernia repair with mesh a year ago. She has developed a bulge to the left and superior to the previous repair. Nontender. She was evaluated in January 2025 by CT scan which showed recurrent periumbilical/incisional hernia with fat. She denies pain. No change in bowel or bladder function..    Review of Systems: A complete review of systems was obtained from the patient. I have reviewed this information and discussed as appropriate with the patient. See HPI as well for other ROS.    Medical History: Past Medical History:  Diagnosis Date  Arthritis  Diabetes mellitus without complication (CMS/HHS-HCC)  Sleep apnea   There is no problem list on file for this patient.  Past Surgical History:  Procedure Laterality Date  CATARACT EXTRACTION  HERNIA REPAIR  tonsilectomy    Allergies  Allergen Reactions  Amoxicillin-Pot Clavulanate Vomiting  Vomiting  Betamethasone, Augmented Nausea  Buprenorphine Hcl Vomiting   Current Outpatient Medications on File Prior to Visit  Medication Sig Dispense Refill  aspirin 81 MG EC tablet Take 81 mg by mouth once daily  BIOTIN ORAL Take by mouth  denosumab  (PROLIA  SUBQ) Inject subcutaneously  ferrous sulfate (IRON ORAL) Take by mouth  VITAMIN D3 ORAL Take by mouth   No current facility-administered medications on file prior to visit.   Family History  Problem Relation Age of Onset  Deep vein thrombosis (DVT or abnormal blood clot formation) Mother  High blood pressure (Hypertension) Father  Hyperlipidemia (Elevated cholesterol) Father  Diabetes Father  Myocardial Infarction (Heart attack) Father    Social History   Tobacco Use  Smoking Status Never  Smokeless Tobacco Never    Social History   Socioeconomic History  Marital status: Married  Tobacco Use  Smoking status: Never  Smokeless tobacco: Never   Social  Drivers of Health   Housing Stability: Unknown (09/02/2023)  Housing Stability Vital Sign  Homeless in the Last Year: No   Objective:   There were no vitals filed for this visit.  There is no height or weight on file to calculate BMI.  Physical Exam Cardiovascular:  Rate and Rhythm: Normal rate.  Pulmonary:  Effort: Pulmonary effort is normal.  Abdominal:  Tenderness: There is no abdominal tenderness.  Hernia: A hernia is present. Hernia is present in the ventral area.   Comments: Scar 3 cm hernia reducible  Musculoskeletal:  Cervical back: Normal range of motion.  Neurological:  Mental Status: She is alert.     Labs, Imaging and Diagnostic Testing:  CLINICAL DATA: Fall abdominal pain.  EXAM: CT ABDOMEN AND PELVIS WITHOUT CONTRAST  TECHNIQUE: Multidetector CT imaging of the abdomen and pelvis was performed following the standard protocol without IV contrast.  RADIATION DOSE REDUCTION: This exam was performed according to the departmental dose-optimization program which includes automated exposure control, adjustment of the mA and/or kV according to patient size and/or use of iterative reconstruction technique.  COMPARISON: None available currently.  FINDINGS: Lower chest: No acute abnormality.  Hepatobiliary: No focal liver abnormality is seen. No gallstones, gallbladder wall thickening, or biliary dilatation.  Pancreas: Fatty replacement of the pancreas is noted.  Spleen: Normal in size without focal abnormality.  Adrenals/Urinary Tract: Adrenal glands appear normal. Left renal cyst is noted for which no further follow-up is required. No hydronephrosis or renal obstruction is noted. Urinary bladder is unremarkable.  Stomach/Bowel: Small sliding-type hiatal hernia. There is no  evidence of bowel obstruction or inflammation. The appendix is not clearly visualized.  Vascular/Lymphatic: Aortic atherosclerosis. No enlarged abdominal or pelvic lymph  nodes.  Reproductive: Uterus and bilateral adnexa are unremarkable.  Other: No ascites is noted. Small fat containing periumbilical hernia is noted. Status post ventral hernia repair in left lower quadrant.  Musculoskeletal: No acute or significant osseous findings.  IMPRESSION: Small sliding-type hiatal hernia.  Small fat containing periumbilical hernia.  Aortic Atherosclerosis (ICD10-I70.0).   Electronically Signed By: Rosalene Colon M.D. On: 08/11/2023 07:51  Assessment and Plan:   Diagnoses and all orders for this visit:  Incisional hernia, without obstruction or gangrene  3 cm reducible incisional hernia  Patient with recurrent incisional hernia from previous hernia repair 1 year ago. She had a small umbilical hernia but looks like on CT scan she has recurred lateral to the repair. Recommend laparoscopic repair of incisional hernia with mesh. Risks and benefits of surgery reviewed as well as pathophysiology. Risk of bleeding, infection, recurrence, mesh use, organ injury, anesthesia risk, the need for open surgery, and exacerbation of underlying medical problems reviewed today. Chronic pain reviewed. She agrees to proceed with laparoscopic repair of recurrent incisional hernia.  No follow-ups on file.

## 2023-11-15 NOTE — Anesthesia Preprocedure Evaluation (Addendum)
 Anesthesia Evaluation  Patient identified by MRN, date of birth, ID band Patient awake    Reviewed: Allergy & Precautions, NPO status , Patient's Chart, lab work & pertinent test results  History of Anesthesia Complications (+) PONV and history of anesthetic complications  Airway Mallampati: III  TM Distance: >3 FB Neck ROM: Full    Dental  (+) Dental Advisory Given   Pulmonary sleep apnea and Continuous Positive Airway Pressure Ventilation    Pulmonary exam normal breath sounds clear to auscultation       Cardiovascular hypertension, Normal cardiovascular exam Rhythm:Regular Rate:Normal     Neuro/Psych  PSYCHIATRIC DISORDERS  Depression    negative neurological ROS     GI/Hepatic negative GI ROS, Neg liver ROS,,,  Endo/Other  diabetes, Well Controlled, Type 2  Obesity  Renal/GU negative Renal ROS  negative genitourinary   Musculoskeletal  (+) Arthritis , Osteoarthritis,  Osteoporosis Recurrent Incisional hernia   Abdominal  (+) + obese  Peds  Hematology negative hematology ROS (+)   Anesthesia Other Findings   Reproductive/Obstetrics                              Anesthesia Physical Anesthesia Plan  ASA: 2  Anesthesia Plan: General   Post-op Pain Management: Minimal or no pain anticipated, Precedex, Dilaudid  IV and Ofirmev  IV (intra-op)*   Induction: Intravenous  PONV Risk Score and Plan: 4 or greater and Treatment may vary due to age or medical condition, Ondansetron , Dexamethasone , TIVA and Propofol  infusion  Airway Management Planned: Oral ETT  Additional Equipment: None  Intra-op Plan:   Post-operative Plan: Extubation in OR  Informed Consent: I have reviewed the patients History and Physical, chart, labs and discussed the procedure including the risks, benefits and alternatives for the proposed anesthesia with the patient or authorized representative who has indicated  his/her understanding and acceptance.     Dental advisory given  Plan Discussed with: CRNA and Anesthesiologist  Anesthesia Plan Comments: (PAT note written 11/15/2023 by Allison Zelenak, PA-C.  )        Anesthesia Quick Evaluation

## 2023-11-26 ENCOUNTER — Encounter (HOSPITAL_COMMUNITY): Payer: Self-pay | Admitting: Surgery

## 2023-11-27 ENCOUNTER — Encounter (HOSPITAL_COMMUNITY)
Admission: RE | Disposition: A | Discharge status: Home / Self Care | Payer: Self-pay | Source: Home / Self Care | Attending: Surgery

## 2023-11-27 ENCOUNTER — Encounter (HOSPITAL_COMMUNITY): Payer: Self-pay | Admitting: Surgery

## 2023-11-27 ENCOUNTER — Ambulatory Visit (HOSPITAL_COMMUNITY): Payer: Self-pay | Admitting: Vascular Surgery

## 2023-11-27 ENCOUNTER — Ambulatory Visit (HOSPITAL_BASED_OUTPATIENT_CLINIC_OR_DEPARTMENT_OTHER): Admitting: Anesthesiology

## 2023-11-27 ENCOUNTER — Ambulatory Visit (HOSPITAL_COMMUNITY)
Admission: RE | Admit: 2023-11-27 | Discharge: 2023-11-27 | Disposition: A | Discharge status: Home / Self Care | Attending: Surgery | Admitting: Surgery

## 2023-11-27 ENCOUNTER — Other Ambulatory Visit: Payer: Self-pay

## 2023-11-27 DIAGNOSIS — I10 Essential (primary) hypertension: Secondary | ICD-10-CM | POA: Diagnosis not present

## 2023-11-27 DIAGNOSIS — Z4589 Encounter for adjustment and management of other implanted devices: Secondary | ICD-10-CM | POA: Diagnosis not present

## 2023-11-27 DIAGNOSIS — Z01818 Encounter for other preprocedural examination: Secondary | ICD-10-CM

## 2023-11-27 DIAGNOSIS — Z5331 Laparoscopic surgical procedure converted to open procedure: Secondary | ICD-10-CM

## 2023-11-27 DIAGNOSIS — K429 Umbilical hernia without obstruction or gangrene: Secondary | ICD-10-CM | POA: Diagnosis not present

## 2023-11-27 DIAGNOSIS — E119 Type 2 diabetes mellitus without complications: Secondary | ICD-10-CM | POA: Insufficient documentation

## 2023-11-27 DIAGNOSIS — K432 Incisional hernia without obstruction or gangrene: Secondary | ICD-10-CM | POA: Diagnosis not present

## 2023-11-27 DIAGNOSIS — G4730 Sleep apnea, unspecified: Secondary | ICD-10-CM | POA: Insufficient documentation

## 2023-11-27 HISTORY — PX: INCISIONAL HERNIA REPAIR: SHX193

## 2023-11-27 LAB — GLUCOSE, CAPILLARY: Glucose-Capillary: 134 mg/dL — ABNORMAL HIGH (ref 70–99)

## 2023-11-27 SURGERY — REPAIR, HERNIA, INCISIONAL, LAPAROSCOPIC
Anesthesia: General | Site: Abdomen

## 2023-11-27 MED ORDER — EPHEDRINE SULFATE-NACL 50-0.9 MG/10ML-% IV SOSY
PREFILLED_SYRINGE | INTRAVENOUS | Status: DC | PRN
  Administered 2023-11-27: 5 mg via INTRAVENOUS

## 2023-11-27 MED ORDER — FENTANYL CITRATE (PF) 250 MCG/5ML IJ SOLN
INTRAMUSCULAR | Status: DC | PRN
  Administered 2023-11-27 (×2): 50 ug via INTRAVENOUS

## 2023-11-27 MED ORDER — LIDOCAINE 2% (20 MG/ML) 5 ML SYRINGE
INTRAMUSCULAR | Status: DC | PRN
  Administered 2023-11-27: 80 mg via INTRAVENOUS

## 2023-11-27 MED ORDER — DEXAMETHASONE SODIUM PHOSPHATE 10 MG/ML IJ SOLN
INTRAMUSCULAR | Status: DC | PRN
  Administered 2023-11-27: 10 mg via INTRAVENOUS

## 2023-11-27 MED ORDER — OXYCODONE HCL 5 MG PO TABS: 5.0000 mg | ORAL_TABLET | Freq: Four times a day (QID) | ORAL | 0 refills | Status: AC | PRN

## 2023-11-27 MED ORDER — FENTANYL CITRATE (PF) 100 MCG/2ML IJ SOLN
INTRAMUSCULAR | Status: AC
  Filled 2023-11-27: qty 4

## 2023-11-27 MED ORDER — DEXAMETHASONE SODIUM PHOSPHATE 10 MG/ML IJ SOLN
INTRAMUSCULAR | Status: AC
Start: 2023-11-27 — End: ?
  Filled 2023-11-27: qty 1

## 2023-11-27 MED ORDER — CLINDAMYCIN PHOSPHATE 900 MG/50ML IV SOLN
900.0000 mg | INTRAVENOUS | Status: AC
  Administered 2023-11-27: 900 mg via INTRAVENOUS
  Filled 2023-11-27: qty 50

## 2023-11-27 MED ORDER — METHOCARBAMOL 500 MG PO TABS: 500.0000 mg | ORAL_TABLET | Freq: Three times a day (TID) | ORAL | 0 refills | Status: AC | PRN

## 2023-11-27 MED ORDER — PROPOFOL 10 MG/ML IV BOLUS
INTRAVENOUS | Status: AC
  Filled 2023-11-27: qty 20

## 2023-11-27 MED ORDER — OXYCODONE HCL 5 MG PO TABS: 5.0000 mg | ORAL_TABLET | Freq: Once | ORAL | Status: DC | PRN

## 2023-11-27 MED ORDER — ONDANSETRON HCL 4 MG/2ML IJ SOLN
INTRAMUSCULAR | Status: AC
Start: 2023-11-27 — End: ?
  Filled 2023-11-27: qty 2

## 2023-11-27 MED ORDER — EPHEDRINE 5 MG/ML INJ
INTRAVENOUS | Status: AC
Start: 2023-11-27 — End: ?
  Filled 2023-11-27: qty 5

## 2023-11-27 MED ORDER — ONDANSETRON HCL 4 MG/2ML IJ SOLN
INTRAMUSCULAR | Status: DC | PRN
  Administered 2023-11-27: 4 mg via INTRAVENOUS

## 2023-11-27 MED ORDER — BUPIVACAINE-EPINEPHRINE 0.25% -1:200000 IJ SOLN
INTRAMUSCULAR | Status: DC | PRN
  Administered 2023-11-27: 53 mL

## 2023-11-27 MED ORDER — PHENYLEPHRINE 80 MCG/ML (10ML) SYRINGE FOR IV PUSH (FOR BLOOD PRESSURE SUPPORT)
PREFILLED_SYRINGE | INTRAVENOUS | Status: DC | PRN
  Administered 2023-11-27: 160 ug via INTRAVENOUS

## 2023-11-27 MED ORDER — ACETAMINOPHEN 500 MG PO TABS
1000.0000 mg | ORAL_TABLET | ORAL | Status: AC
  Administered 2023-11-27: 1000 mg via ORAL
  Filled 2023-11-27: qty 2

## 2023-11-27 MED ORDER — HYDROMORPHONE HCL 1 MG/ML IJ SOLN
INTRAMUSCULAR | Status: AC
  Filled 2023-11-27: qty 1

## 2023-11-27 MED ORDER — CHLORHEXIDINE GLUCONATE CLOTH 2 % EX PADS: 6.0000 | MEDICATED_PAD | Freq: Once | CUTANEOUS | Status: DC

## 2023-11-27 MED ORDER — GABAPENTIN 300 MG PO CAPS
300.0000 mg | ORAL_CAPSULE | ORAL | Status: AC
  Administered 2023-11-27: 300 mg via ORAL
  Filled 2023-11-27: qty 1

## 2023-11-27 MED ORDER — BUPIVACAINE-EPINEPHRINE (PF) 0.25% -1:200000 IJ SOLN
INTRAMUSCULAR | Status: AC
Start: 2023-11-27 — End: ?
  Filled 2023-11-27: qty 30

## 2023-11-27 MED ORDER — GLYCOPYRROLATE PF 0.2 MG/ML IJ SOSY
PREFILLED_SYRINGE | INTRAMUSCULAR | Status: AC
  Filled 2023-11-27: qty 1

## 2023-11-27 MED ORDER — ONDANSETRON HCL 4 MG/2ML IJ SOLN
4.0000 mg | Freq: Once | INTRAMUSCULAR | Status: DC | PRN
Start: 2023-11-27 — End: 2023-11-27

## 2023-11-27 MED ORDER — LIDOCAINE 2% (20 MG/ML) 5 ML SYRINGE
INTRAMUSCULAR | Status: AC
Start: 2023-11-27 — End: ?
  Filled 2023-11-27: qty 5

## 2023-11-27 MED ORDER — INSULIN ASPART 100 UNIT/ML IJ SOLN: 0.0000 [IU] | INTRAMUSCULAR | Status: DC | PRN

## 2023-11-27 MED ORDER — PHENYLEPHRINE 80 MCG/ML (10ML) SYRINGE FOR IV PUSH (FOR BLOOD PRESSURE SUPPORT)
PREFILLED_SYRINGE | INTRAVENOUS | Status: AC
Start: 2023-11-27 — End: ?
  Filled 2023-11-27: qty 10

## 2023-11-27 MED ORDER — OXYCODONE HCL 5 MG/5ML PO SOLN: 5.0000 mg | Freq: Once | ORAL | Status: DC | PRN

## 2023-11-27 MED ORDER — HYDROMORPHONE HCL 1 MG/ML IJ SOLN
0.2500 mg | INTRAMUSCULAR | Status: DC | PRN
  Administered 2023-11-27 (×2): 0.5 mg via INTRAVENOUS

## 2023-11-27 MED ORDER — PROPOFOL 10 MG/ML IV BOLUS
INTRAVENOUS | Status: DC | PRN
  Administered 2023-11-27: 200 mg via INTRAVENOUS
  Administered 2023-11-27: 150 ug/kg/min via INTRAVENOUS
  Administered 2023-11-27: 20 mg via INTRAVENOUS

## 2023-11-27 MED ORDER — SUGAMMADEX SODIUM 200 MG/2ML IV SOLN
INTRAVENOUS | Status: DC | PRN
  Administered 2023-11-27: 200 mg via INTRAVENOUS

## 2023-11-27 MED ORDER — CHLORHEXIDINE GLUCONATE 0.12 % MT SOLN
15.0000 mL | Freq: Once | OROMUCOSAL | Status: AC
  Administered 2023-11-27: 15 mL via OROMUCOSAL
  Filled 2023-11-27: qty 15

## 2023-11-27 MED ORDER — SODIUM CHLORIDE (PF) 0.9 % IJ SOLN
INTRAMUSCULAR | Status: AC
  Filled 2023-11-27: qty 10

## 2023-11-27 MED ORDER — ORAL CARE MOUTH RINSE: 15.0000 mL | Freq: Once | OROMUCOSAL | Status: AC

## 2023-11-27 MED ORDER — ROCURONIUM BROMIDE 10 MG/ML (PF) SYRINGE
PREFILLED_SYRINGE | INTRAVENOUS | Status: AC
  Filled 2023-11-27: qty 10

## 2023-11-27 MED ORDER — GLYCOPYRROLATE PF 0.2 MG/ML IJ SOSY
PREFILLED_SYRINGE | INTRAMUSCULAR | Status: DC | PRN
  Administered 2023-11-27: .2 mg via INTRAVENOUS

## 2023-11-27 MED ORDER — ROCURONIUM BROMIDE 10 MG/ML (PF) SYRINGE
PREFILLED_SYRINGE | INTRAVENOUS | Status: DC | PRN
  Administered 2023-11-27: 50 mg via INTRAVENOUS
  Administered 2023-11-27 (×2): 10 mg via INTRAVENOUS

## 2023-11-27 SURGICAL SUPPLY — 43 items
BAG COUNTER SPONGE SURGICOUNT (BAG) ×2 IMPLANT
BINDER ABDOMINAL 12 ML 46-62 (SOFTGOODS) IMPLANT
BLADE CLIPPER SURG (BLADE) IMPLANT
CANISTER SUCTION 3000ML PPV (SUCTIONS) ×2 IMPLANT
CHLORAPREP W/TINT 26 (MISCELLANEOUS) ×2 IMPLANT
CLIP APPLIE ROT 10 11.4 M/L (STAPLE) IMPLANT
COVER SURGICAL LIGHT HANDLE (MISCELLANEOUS) ×2 IMPLANT
DERMABOND ADVANCED .7 DNX12 (GAUZE/BANDAGES/DRESSINGS) ×2 IMPLANT
DEVICE SECURE STRAP 25 ABSORB (INSTRUMENTS) IMPLANT
DEVICE TROCAR PUNCTURE CLOSURE (ENDOMECHANICALS) ×2 IMPLANT
DRAPE WARM FLUID 44X44 (DRAPES) ×2 IMPLANT
ELECTRODE REM PT RTRN 9FT ADLT (ELECTROSURGICAL) ×2 IMPLANT
GLOVE BIO SURGEON STRL SZ8 (GLOVE) ×2 IMPLANT
GLOVE BIOGEL PI IND STRL 8 (GLOVE) ×2 IMPLANT
GOWN STRL REUS W/ TWL LRG LVL3 (GOWN DISPOSABLE) ×4 IMPLANT
GOWN STRL REUS W/ TWL XL LVL3 (GOWN DISPOSABLE) ×2 IMPLANT
IRRIGATION SUCT STRKRFLW 2 WTP (MISCELLANEOUS) IMPLANT
KIT BASIN OR (CUSTOM PROCEDURE TRAY) ×2 IMPLANT
KIT TURNOVER KIT B (KITS) ×2 IMPLANT
MARKER SKIN DUAL TIP RULER LAB (MISCELLANEOUS) ×2 IMPLANT
MESH OVITEX 1S PERM 6X10 6L (Mesh General) IMPLANT
NS IRRIG 1000ML POUR BTL (IV SOLUTION) ×2 IMPLANT
PAD ARMBOARD POSITIONER FOAM (MISCELLANEOUS) ×4 IMPLANT
PENCIL SMOKE EVACUATOR (MISCELLANEOUS) ×2 IMPLANT
SCISSORS LAP 5X35 DISP (ENDOMECHANICALS) ×2 IMPLANT
SET TUBE SMOKE EVAC HIGH FLOW (TUBING) ×2 IMPLANT
SHEARS HARMONIC ACE PLUS 36CM (ENDOMECHANICALS) IMPLANT
SLEEVE Z-THREAD 5X100MM (TROCAR) ×2 IMPLANT
SUT MON AB 4-0 PC3 18 (SUTURE) ×2 IMPLANT
SUT NOVA NAB DX-16 0-1 5-0 T12 (SUTURE) ×2 IMPLANT
SUT VIC AB 0 CT1 27XBRD ANBCTR (SUTURE) IMPLANT
SUT VIC AB 0 CT1 27XBRD ANTBC (SUTURE) IMPLANT
TOWEL GREEN STERILE (TOWEL DISPOSABLE) ×2 IMPLANT
TOWEL GREEN STERILE FF (TOWEL DISPOSABLE) ×2 IMPLANT
TRAY FOLEY W/BAG SLVR 16FR ST (SET/KITS/TRAYS/PACK) IMPLANT
TRAY LAPAROSCOPIC MC (CUSTOM PROCEDURE TRAY) ×2 IMPLANT
TROCAR 11X100 Z THREAD (TROCAR) ×2 IMPLANT
TROCAR XCEL NON-BLD 5MMX100MML (ENDOMECHANICALS) IMPLANT
TROCAR Z-THREAD OPTICAL 5X100M (TROCAR) ×2 IMPLANT
TUBE CONNECTING 12X1/4 (SUCTIONS) ×2 IMPLANT
WARMER LAPAROSCOPE (MISCELLANEOUS) ×2 IMPLANT
WATER STERILE IRR 1000ML POUR (IV SOLUTION) ×2 IMPLANT
YANKAUER SUCT BULB TIP NO VENT (SUCTIONS) ×2 IMPLANT

## 2023-11-27 NOTE — Op Note (Signed)
 Preoperative diagnosis: Recurrent umbilical hernia  Postoperative diagnosis: Same  Procedure: Diagnostic laparoscopy #1 #2 open repair with explantation of previous mesh 2 cm recurrent periumbilical hernia with ovitex 1s 6 cm x 10 cm mesh  Surgeon: Sim Dryer, MD  Anesthesia: General With 0.25% Marcaine  with local epinephrine   Drains: None  Specimen: Previous mesh to pathology  EBL: 10 cc  Indications for procedure: The patient is a 78 year old female with previous history of repair of umbilical hernia with mesh.  She is recurred laterally to this.  She presents for laparoscopy with subsequent repair of recurrent periumbilical hernia.  Risks and benefits of hernia surgery reviewed as well as techniques of laparoscopic and open depending on intraoperative findings.The risk of hernia repair include bleeding,  Infection,   Recurrence of the hernia,  Mesh use, chronic pain,  Organ injury,  Bowel injury,  Bladder injury,   nerve injury with numbness around the incision,  Death,  and worsening of preexisting  medical problems.  The alternatives to surgery have been discussed as well..  Long term expectations of both operative and non operative treatments have been discussed.   The patient agrees to proceed.   Description of procedure: The patient was met in holding area and questions answered.  She was then taken back to the operating placed supine upon the operative room table.  After induction of general anesthesia, her abdomen was prepped and draped in a sterile fashion and timeout was performed.  She received appropriate preoperative antibiotics.  A 5 mm incision was made in the left upper quadrant for placement Optiview port.  The Optiview was then passed to the abdominal wall layers under direct visual guidance until the abdominal cavity is entered.  This was done easily.  Insufflation to 15 mmHg of CO2 pressure was then performed a laparoscope placed.  Upon examination there was significant  omentum adhered to the previous mesh.  There is a small 2 cm defect lateral to the mesh toward the patient's left shoulder.  The mesh also was no longer attached to the abdominal wall was more implanted in the omentum.  Given these findings I felt mesh explantation would be beneficial and given the small size of the hernia I felt were open repair better to be able to visualize his defect due to the severe adhesions and also the need to remove the entire piece of mesh.  The laparoscope was removed and the CO2 was allowed to escape.  Of note there is no evidence of any injury with insertion of this port.  An incision was made along the inferior border of the umbilicus and extended toward both flanks.  This was then dissected down and the hernia defect and mesh were identified.  The mesh was mostly adherent to the omentum and had pulled away from the abdominal wall.  We carefully dissected this away and defined the defect which measured about 2 x 3 cm.  I then removed the mesh from the omentum with cautery and passed off the field.  We used a 6 cm x 10 cm 1S OV I text mesh.  This was soaked.  We then placed this in an underlay fashion in the peritoneal cavity.  It was secured to the underlying fascial edge through the entire abdominal wall with #1 Novafil circumferentially.  We then closed the fascial layers over top of this with interrupted #1 Novafil suture.  Additional sutures were placed in the rectus to try to pull together the midline since there was  some diastases above this very carefully.  The defect closed well.  There is no undue tension.  Local anesthetic was infiltrated throughout.  The deep tissue planes were approximated with 0 Vicryl.  4 Monocryl was used to secure the skin.  Dermabond applied.  Abdominal binder placed.  All counts were found to be correct.  The patient was then awoken extubated taken to recovery in satisfactory condition.

## 2023-11-27 NOTE — Anesthesia Postprocedure Evaluation (Signed)
 Anesthesia Post Note  Patient: Heather Santos  Procedure(s) Performed: REPAIR, HERNIA, INCISIONAL, LAPAROSCOPIC, Attemped REPAIR, HERNIA, INCISIONAL, OPEN (Abdomen)     Patient location during evaluation: PACU Anesthesia Type: General Level of consciousness: awake and alert and oriented Pain management: pain level controlled Vital Signs Assessment: post-procedure vital signs reviewed and stable Respiratory status: spontaneous breathing, nonlabored ventilation and respiratory function stable Cardiovascular status: blood pressure returned to baseline and stable Postop Assessment: no apparent nausea or vomiting Anesthetic complications: no   No notable events documented.  Last Vitals:  Vitals:   11/27/23 1045 11/27/23 1100  BP: (!) 149/67 (!) 156/64  Pulse: 72 66  Resp: 19 (!) 25  Temp: 36.6 C   SpO2: 95% 96%    Last Pain:  Vitals:   11/27/23 1100  TempSrc:   PainSc: 8                  Doxie Augenstein A.

## 2023-11-27 NOTE — Anesthesia Procedure Notes (Signed)
 Procedure Name: Intubation Date/Time: 11/27/2023 8:18 AM  Performed by: Trenton Frock, CRNAPre-anesthesia Checklist: Patient identified, Emergency Drugs available, Suction available and Patient being monitored Patient Re-evaluated:Patient Re-evaluated prior to induction Oxygen Delivery Method: Circle System Utilized Preoxygenation: Pre-oxygenation with 100% oxygen Induction Type: IV induction Ventilation: Mask ventilation without difficulty Laryngoscope Size: Mac and 3 Grade View: Grade I Tube type: Oral Tube size: 7.0 mm Number of attempts: 1 Airway Equipment and Method: Stylet, Oral airway and Bite block Placement Confirmation: ETT inserted through vocal cords under direct vision, positive ETCO2 and breath sounds checked- equal and bilateral Tube secured with: Tape Dental Injury: Teeth and Oropharynx as per pre-operative assessment

## 2023-11-27 NOTE — Discharge Instructions (Signed)

## 2023-11-27 NOTE — Interval H&P Note (Signed)
 History and Physical Interval Note:  11/27/2023 7:56 AM  Heather Santos  has presented today for surgery, with the diagnosis of RECURRENT INCISIONAL HERNIA.  The various methods of treatment have been discussed with the patient and family. After consideration of risks, benefits and other options for treatment, the patient has consented to  Procedure(s) with comments: REPAIR, HERNIA, INCISIONAL, LAPAROSCOPIC (N/A) - recurrent as a surgical intervention.  The patient's history has been reviewed, patient examined, no change in status, stable for surgery.  I have reviewed the patient's chart and labs.  Questions were answered to the patient's satisfaction.   The risk of hernia repair include bleeding,  Infection,   Recurrence of the hernia,  Mesh use, chronic pain,  Organ injury,  Bowel injury,  Bladder injury,   nerve injury with numbness around the incision,  Death,  and worsening of preexisting  medical problems.  The alternatives to surgery have been discussed as well..  Long term expectations of both operative and non operative treatments have been discussed.   The patient agrees to proceed.   Kaycie Pegues A Gentry Pilson

## 2023-11-27 NOTE — Transfer of Care (Signed)
 Immediate Anesthesia Transfer of Care Note  Patient: Heather Santos  Procedure(s) Performed: REPAIR, HERNIA, INCISIONAL, LAPAROSCOPIC, Attemped REPAIR, HERNIA, INCISIONAL, OPEN (Abdomen)  Patient Location: PACU  Anesthesia Type:General  Level of Consciousness: oriented and patient cooperative  Airway & Oxygen Therapy: Patient Spontanous Breathing and Patient connected to nasal cannula oxygen  Post-op Assessment: Report given to RN, Post -op Vital signs reviewed and stable, Patient moving all extremities X 4, and Patient able to stick tongue midline  Post vital signs: Reviewed and stable  Last Vitals:  Vitals Value Taken Time  BP 149/67 11/27/23 1045  Temp 97.9   Pulse 68 11/27/23 1045  Resp 16 11/27/23 1045  SpO2 95 % 11/27/23 1045  Vitals shown include unfiled device data.  Last Pain:  Vitals:   11/27/23 0735  TempSrc:   PainSc: 0-No pain         Complications: No notable events documented.

## 2023-11-28 ENCOUNTER — Encounter (HOSPITAL_COMMUNITY): Payer: Self-pay | Admitting: Surgery

## 2023-11-29 LAB — SURGICAL PATHOLOGY

## 2023-12-11 ENCOUNTER — Telehealth: Payer: Self-pay | Admitting: Cardiology

## 2023-12-11 NOTE — Telephone Encounter (Signed)
 Pt states that she still has not rec'vd a call nor supplies from [facility] for cpap. Requesting cb

## 2023-12-12 NOTE — Telephone Encounter (Signed)
 Notified patient that order for Chin strap and supplies has been sent to [facility] and provided patient with [employer] [facility] number.

## 2023-12-17 DIAGNOSIS — G4733 Obstructive sleep apnea (adult) (pediatric): Secondary | ICD-10-CM | POA: Diagnosis not present

## 2023-12-17 DIAGNOSIS — K7581 Nonalcoholic steatohepatitis (NASH): Secondary | ICD-10-CM | POA: Diagnosis not present

## 2023-12-17 DIAGNOSIS — M810 Age-related osteoporosis without current pathological fracture: Secondary | ICD-10-CM | POA: Diagnosis not present

## 2023-12-17 DIAGNOSIS — I10 Essential (primary) hypertension: Secondary | ICD-10-CM | POA: Diagnosis not present

## 2023-12-17 DIAGNOSIS — E1169 Type 2 diabetes mellitus with other specified complication: Secondary | ICD-10-CM | POA: Diagnosis not present

## 2023-12-17 LAB — LAB REPORT - SCANNED
A1c: 6.5
EGFR: 82

## 2023-12-18 ENCOUNTER — Ambulatory Visit: Payer: Self-pay | Admitting: Cardiology

## 2023-12-26 NOTE — Telephone Encounter (Signed)
 Call to patient to advise that labs were normal.  Patient verbalizes understanding and agrees to continue current medical therapy.

## 2023-12-26 NOTE — Telephone Encounter (Signed)
-----   Message from Wilbert Bihari sent at 12/18/2023  9:10 AM EDT ----- Please let patient know that labs were normal.  Continue current medical therapy. ----- Message ----- From: Girard Frederick Sent: 12/18/2023   8:15 AM EDT To: Wilbert JONELLE Bihari, MD

## 2024-01-01 ENCOUNTER — Telehealth: Payer: Self-pay

## 2024-01-01 NOTE — Telephone Encounter (Signed)
-----   Message from Nurse Geni E sent at 12/26/2023  3:07 PM EDT ----- Regarding: Patient needs cpap supplies Hi team,  I was calling this paitent about some lab results and she says she has been having difficulty getting supplies from her DME. Her cpap works but she is just running out of cushions, etc and is having difficulty getting more. Could someone call her to see if it can be resolveD?  Thanks, Geni, RN

## 2024-01-01 NOTE — Telephone Encounter (Signed)
 Order sent to [facility] for chin strap and supplies sent today.

## 2024-01-08 DIAGNOSIS — M810 Age-related osteoporosis without current pathological fracture: Secondary | ICD-10-CM | POA: Diagnosis not present

## 2024-01-28 DIAGNOSIS — R0781 Pleurodynia: Secondary | ICD-10-CM | POA: Diagnosis not present

## 2024-04-21 DIAGNOSIS — S83411A Sprain of medial collateral ligament of right knee, initial encounter: Secondary | ICD-10-CM | POA: Diagnosis not present

## 2024-04-21 DIAGNOSIS — S76311A Strain of muscle, fascia and tendon of the posterior muscle group at thigh level, right thigh, initial encounter: Secondary | ICD-10-CM | POA: Diagnosis not present

## 2024-04-23 DIAGNOSIS — M25561 Pain in right knee: Secondary | ICD-10-CM | POA: Diagnosis not present

## 2024-04-27 DIAGNOSIS — M25561 Pain in right knee: Secondary | ICD-10-CM | POA: Diagnosis not present

## 2024-04-29 DIAGNOSIS — M25561 Pain in right knee: Secondary | ICD-10-CM | POA: Diagnosis not present

## 2024-05-06 NOTE — Progress Notes (Addendum)
 Heather Santos                                          MRN: [id number]   06/09/2024   The [facility] Quality Team Specialist reviewed this patient medical record for the purposes of chart review for care gap closure. The following were reviewed: chart review for care gap closure-kidney health evaluation for diabetes:uACR.    [facility] Quality Team

## 2024-06-16 LAB — LAB REPORT - SCANNED
A1c: 6.3
Albumin, Urine POC: 1.42
Creatinine, POC: 125 mg/dL
EGFR: 72
Microalb Creat Ratio: 11.4
